# Patient Record
Sex: Female | Born: 1955 | Race: Black or African American | Hispanic: No | Marital: Married | Smoking: Never smoker
Health system: Southern US, Community
[De-identification: ages and names within clinical notes are randomized; demographics above are authoritative.]

## PROBLEM LIST (undated history)

## (undated) DIAGNOSIS — I639 Cerebral infarction, unspecified: Secondary | ICD-10-CM

## (undated) DIAGNOSIS — I10 Essential (primary) hypertension: Secondary | ICD-10-CM

## (undated) DIAGNOSIS — D869 Sarcoidosis, unspecified: Secondary | ICD-10-CM

## (undated) DIAGNOSIS — K219 Gastro-esophageal reflux disease without esophagitis: Secondary | ICD-10-CM

## (undated) HISTORY — DX: Gastro-esophageal reflux disease without esophagitis: K21.9

## (undated) HISTORY — PX: BREAST BIOPSY: SHX20

## (undated) HISTORY — DX: Sarcoidosis, unspecified: D86.9

## (undated) HISTORY — PX: COLONOSCOPY: SHX174

## (undated) HISTORY — DX: Essential (primary) hypertension: I10

## (undated) HISTORY — PX: OTHER SURGICAL HISTORY: SHX169

## (undated) HISTORY — PX: VESICOVAGINAL FISTULA CLOSURE W/ TAH: SUR271

## (undated) HISTORY — DX: Cerebral infarction, unspecified: I63.9

---
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
## 2006-06-28 HISTORY — PX: OTHER SURGICAL HISTORY: SHX169

## 2007-06-14 DIAGNOSIS — N390 Urinary tract infection, site not specified: Secondary | ICD-10-CM

## 2007-06-14 HISTORY — DX: Urinary tract infection, site not specified: N39.0

## 2007-06-29 HISTORY — PX: CHOLECYSTECTOMY: SHX55

## 2008-03-26 ENCOUNTER — Ambulatory Visit: Payer: Self-pay | Admitting: Thoracic Surgery (Cardiothoracic Vascular Surgery)

## 2008-03-28 ENCOUNTER — Ambulatory Visit: Payer: Self-pay | Admitting: Thoracic Surgery (Cardiothoracic Vascular Surgery)

## 2008-04-05 ENCOUNTER — Ambulatory Visit: Payer: Self-pay | Admitting: Thoracic Surgery (Cardiothoracic Vascular Surgery)

## 2008-04-05 ENCOUNTER — Encounter: Payer: Self-pay | Admitting: Thoracic Surgery (Cardiothoracic Vascular Surgery)

## 2008-04-05 ENCOUNTER — Ambulatory Visit (HOSPITAL_COMMUNITY)
Admission: RE | Admit: 2008-04-05 | Discharge: 2008-04-05 | Payer: Self-pay | Admitting: Thoracic Surgery (Cardiothoracic Vascular Surgery)

## 2008-04-11 ENCOUNTER — Ambulatory Visit: Payer: Self-pay | Admitting: Thoracic Surgery (Cardiothoracic Vascular Surgery)

## 2008-06-28 HISTORY — PX: MEDIASTINOSCOPY: SUR861

## 2009-11-15 ENCOUNTER — Encounter: Admission: RE | Admit: 2009-11-15 | Discharge: 2009-11-15 | Payer: Self-pay | Admitting: Orthopedic Surgery

## 2009-12-04 ENCOUNTER — Encounter: Admission: RE | Admit: 2009-12-04 | Discharge: 2009-12-04 | Payer: Self-pay | Admitting: Specialist

## 2010-02-03 LAB — PULMONARY FUNCTION TEST

## 2010-05-18 ENCOUNTER — Encounter: Admission: RE | Admit: 2010-05-18 | Discharge: 2010-05-18 | Payer: Self-pay | Admitting: Vascular Surgery

## 2010-06-19 ENCOUNTER — Encounter
Admission: RE | Admit: 2010-06-19 | Discharge: 2010-06-19 | Payer: Self-pay | Source: Home / Self Care | Attending: Allergy and Immunology | Admitting: Allergy and Immunology

## 2010-07-10 ENCOUNTER — Encounter
Admission: RE | Admit: 2010-07-10 | Discharge: 2010-07-10 | Payer: Self-pay | Source: Home / Self Care | Attending: Unknown Physician Specialty | Admitting: Unknown Physician Specialty

## 2010-07-18 ENCOUNTER — Other Ambulatory Visit: Payer: Self-pay | Admitting: Vascular Surgery

## 2010-07-18 DIAGNOSIS — Z1239 Encounter for other screening for malignant neoplasm of breast: Secondary | ICD-10-CM

## 2010-10-19 ENCOUNTER — Ambulatory Visit
Admission: RE | Admit: 2010-10-19 | Discharge: 2010-10-19 | Disposition: A | Discharge status: Home / Self Care | Payer: PRIVATE HEALTH INSURANCE | Source: Ambulatory Visit | Attending: *Deleted | Admitting: *Deleted

## 2010-10-19 ENCOUNTER — Other Ambulatory Visit: Payer: Self-pay | Admitting: *Deleted

## 2010-10-19 DIAGNOSIS — D869 Sarcoidosis, unspecified: Secondary | ICD-10-CM

## 2010-11-10 NOTE — Op Note (Signed)
NAMERIDHIMA, GOLBERG              ACCOUNT NO.:  [id number]   MEDICAL RECORD NO.:  [id number]          PATIENT TYPE:  AMB   LOCATION:  SDS                          FACILITY:  [facility]   PHYSICIAN:  Salvatore Decent. Dorris Fetch, M.D.DATE OF BIRTH:  09-01-1955   DATE OF PROCEDURE:  04/05/2008  DATE OF DISCHARGE:  04/05/2008                               OPERATIVE REPORT   PREOPERATIVE DIAGNOSES:  Mediastinal and hilar adenopathy.   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   PROCEDURE:  Mediastinoscopy.   SURGEON:  Salvatore Decent. Dorris Fetch, MD   ANESTHESIA:  General.   FINDINGS:  Enlarged nodes in the mediastinum.  Frozen section revealed  noncaseating granulomas consistent with sarcoidosis.   CLINICAL NOTE:  Ms. Flight is a 55 year old woman who presented with  cough and persistent dyspnea.  Workup had revealed mediastinal  adenopathy in June of this year.  She subsequently had a repeat CT scan  in September which showed interval increase in the size of her lymph  nodes.  She was referred for mediastinoscopy.  I discussed in detail  with her the indications, risks, benefits, and alternatives.  She  understood and accepted risks and agreed to proceed.   OPERATIVE NOTE:  Ms. Cranmer was brought to the preop holding area on  April 05, 2008.  There, Anesthesia established intravenous access and  arterial blood pressure monitoring catheter was placed.  Intravenous  antibiotics were administered.  PAS hose were placed for DVT  prophylaxis.  The patient was taken to the operating room, anesthetized,  and intubated.  The neck and chest were prepped and draped in the usual  fashion.  An incision was made 1 fingerbreadth above the sternal notch  in a transverse fashion.  It was carried through the skin and  subcutaneous tissue.  The strap muscles were separated in midline.  The  pretracheal fascia was identified and incised.  The pretracheal plane  was  developed bluntly into the mediastinum.  Mediastinoscope was  inserted and systematic inspection of the mediastinal lymph node  stations was carried out.  There were enlarged gray-to-whitish appearing  nodes.  Biopsies were taken from multiple lymph node stations.  Hemostasis was achieved with minimal use of electrocautery.  Multiple  nodes were sent for both pathology and culture.  Frozen section of one  of the nodes revealed noncaseating granulomas consistent with  sarcoidosis.  The wound was packed for 5 minutes.  The packing was  removed.  Mediastinoscope was inserted for final inspection for  hemostasis which was good.  The  mediastinoscope was withdrawn.  The incision was closed in 2 layers with  an interrupted 3-0 Vicryl subcutaneous suture and a 4-0 Vicryl  subcuticular suture.  All sponge, needle, and instrument counts were  correct at the end of the procedure.  The patient was extubated in the  operating room and taken to the recovery room in good condition.      Salvatore Decent Dorris Fetch, M.D.  Electronically Signed     SCH/MEDQ  D:  04/08/2008  T:  04/09/2008  Job:  [id number]   cc:  Celine Mans  Tanvir A. Chodri, M.D.

## 2010-11-10 NOTE — Consult Note (Signed)
NEW PATIENT CONSULTATION   LAVELLE, BERLAND D  DOB:  12/10/1955                                        March 26, 2008  CHART #:  [id number]   The patient is a 55 year old woman, who presents with a chief complaint  of dyspnea.   The patient was in her usual state of health until June 2009 when she  noted onset of cough and shortness of breath.  She saw her medical  doctor and was treated, but continued to have a persistent cough.  One  day, she stopped at Urgent Care, a chest x-ray was done and she was  noted to have question of enlarged mediastinal lymph nodes.  A CT scan  then was done, this was in June and showed some mediastinal adenopathy.  This was rather mild and she was scheduled for a repeat scan, which was  done on February 27, 2008.  That scan showed interval enlargement of the  mediastinal and bilateral adenopathy.  Of note, she does not have any  significant pulmonary nodules or infiltrates.  She has continued to have  dyspnea, particularly with exertion.  Her cough has improved.  She also  complains of occasional sharp left-sided chest pain.  She denies any  exposure to tuberculosis.  She has not been around any construction  sites, pigeons, and has not traveled out of the country recently.  She  says that she has not had any fevers, although she did feel chilled last  week.  She did not feel ill enough to seek medical attention for it.  She has not had any night sweats.  She does state that sometimes after  she eats, she gets a burning sensation in her lower chest.  She does not  get that with exertion.   PAST MEDICAL HISTORY:  Significant for hypertension, previous breast  biopsies, previous total hysterectomy, and cholecystectomy.   CURRENT MEDICATIONS:  Vivelle patch 0.1 mg per 24 hours twice weekly,  moexipril/hydrochlorothiazide 15/12.5 one tablet daily, Prevacid 30 mg  daily as needed, and aspirin 81 mg daily.   ALLERGIES:  She has  allergies.  She lists IV contrast, which causes GI  upset.  No anaphylactic symptoms.   FAMILY HISTORY:  Significant for heart disease with coronary artery  disease in both mother and father.  No history of sarcoid or lymphoma.   SOCIAL HISTORY:  She is married.  She works full-time as a Public librarian.  She does not drink or smoke.   REVIEW OF SYSTEMS:  The patient's medical history form is reviewed and  is on the chart.  Of note, she complains of this pressure along her  upper abdomen, reflux-type burning sensation, shortness of breath  particularly when walking up an incline.  On careful questioning, this  does not sound consistent with angina.  She also complains of headaches  and also has had recent change in eyesight.  Recently, she got a new  prescription for her glasses, but still is having some difficulty.  She  has not had any recent weight loss.   PHYSICAL EXAMINATION:  GENERAL:  The patient is an overweight, 55-year-  old [place] American female in no acute distress.  VITAL SIGNS:  Her blood pressure is 141/94, pulse 66, respirations are  18, and her oxygen saturation is 95% on  room air.  HEENT:  She is wearing glasses, otherwise unremarkable.  NECK:  Supple without thyromegaly, adenopathy, or bruits.  LUNGS:  Clear with equal breath sounds bilaterally.  CARDIAC:  Regular rate and rhythm.  Normal S1 and S2.  No rubs or  murmurs.  ABDOMEN:  Soft and nontender.  There is no hepatosplenomegaly.  EXTREMITIES:  Without clubbing, cyanosis, or edema.  She has 2+ pulses  throughout.   CT scan is reviewed.  It shows some moderate mediastinal and hilar  adenopathy.   IMPRESSION:  The patient is a 55 year old [place] American female, who  presented with cough and dyspnea.  She has had persistent dyspnea.  She  has been found to have mediastinal adenopathy.  She has not had recent  infection and the nodes have increased in size from June until  September.   Therefore, I do think investigation is warranted.  The  likely differential diagnosis is sarcoidosis versus lymphoma, although  there are other possibilities, those are the two most likely causes with  sarcoid being the more likely of the two.  I reviewed the CT scan with  the patient and her husband and discussed our options.  I recommended to  them that we proceed with mediastinoscopy.  I discussed in detail with  them the indications, risks, benefits, and alternatives.  They  understand the risks include, but not limited to bleeding, possible need  for transfusions or bigger operation, pneumothorax, infection, recurrent  nerve injury with  transient or permanent hoarseness, stroke, or  esophageal injury.  She understands and accepts these risks and agrees  to proceed with biopsy.  They do understand that this is diagnostic and  not therapeutic and they do understand that there is no definite  causative link between the mediastinal nodes and  her symptoms, but do feel it is important to establish a diagnosis given  the interval growth of these lymph nodes.   Salvatore Decent Dorris Fetch, M.D.  Electronically Signed   SCH/MEDQ  D:  03/26/2008  T:  03/26/2008  Job:  [id number]   cc:   Celine Mans  Tanvir A. Chodri, M.D.

## 2010-11-10 NOTE — Assessment & Plan Note (Signed)
OFFICE VISIT   DEEM, MARMOL  DOB:  1955-10-17                                        April 11, 2008  CHART #:  [id number]   The patient is a 55 year old woman, who had presented with a chief  complaint of dyspnea.  She was found to have mediastinal and bilateral  hilar adenopathy.  On April 05, 2008, she underwent mediastinoscopy.  Pathology confirmed noncaseating granulomas consistent with sarcoid.  She returns today.  She said she noticed a lump in her incisions.  She  has been having a frequent cough which is productive and says she has a  feeling of strangling when she is sleeping at night.   PHYSICAL EXAMINATION:  GENERAL:  The patient is a well-appearing 52-year-  old woman in no acute distress.  VITAL SIGNS:  Her blood pressure 141/92, pulse 80, respirations 18, her  oxygen saturation is 99% on room air.  SKIN:  Her incision is clean, dry, and intact.  There is no evidence of  infection.  There is a little induration as well as a little swelling  superiorly.  There is no evidence of infection.  LUNGS:  Mildly congested.   IMPRESSION:  The patient is a 55 year old woman.  She is status post  mediastinoscopy which confirmed diagnosis of sarcoidosis.  I am going to  defer to Dr. Marcellus Scott in [place] regarding treatment of that, so I  have not started her on any medication for that.  Her wound is fine.  I  told her she could put some Neosporin on that if she would like, it may  help that fade a little more quickly.  Regarding her cough, it is most  likely is viral, could be related to sarcoid, I do not think she needs  antibiotics at this point in time.  She has not had any fevers, chills,  or sweats.  I did advise her that she were to have worsening cough or  breathing or develop a high  fever, she is to contact her medical doctor.  She will follow up with  Dr. Royal Hawthorn and Dr. Blenda Nicely.  I would be happy to see her back anytime if  can be of  any further assistance with her care.   Salvatore Decent Dorris Fetch, M.D.  Electronically Signed   SCH/MEDQ  D:  04/11/2008  T:  04/12/2008  Job:  [id number]

## 2010-11-10 NOTE — Letter (Signed)
April 11, 2008   Tanvir A. Chodri, MD  [address], [place] [address]   Re:  Desiree Montgomery, Desiree Montgomery              DOB:  1956-05-31   Dear Eual Fines:   I saw the patient back in the office.  As you know, she is a very nice  55 year old woman who had shortness of breath and a persisting cough.  She was found by chest x-ray and CT that had mediastinal and hilar  adenopathy.  We did a mediastinoscopy on 04/05/2008, which revealed non-  caseating granulomas consistent with sarcoidosis.  I advised the patient  to contact to your office for followup.  I will defer to your expertise  in terms of treatment.  Please do not hesitate to contact me, if I can  be of any further assistance with her care.   Salvatore Decent Dorris Fetch, M.D.  Electronically Signed   SCH/MEDQ  D:  04/11/2008  T:  04/11/2008  Job:  [id number]   cc:   Celine Mans

## 2011-02-26 ENCOUNTER — Other Ambulatory Visit: Payer: Self-pay | Admitting: Unknown Physician Specialty

## 2011-02-26 ENCOUNTER — Ambulatory Visit
Admission: RE | Admit: 2011-02-26 | Discharge: 2011-02-26 | Disposition: A | Discharge status: Home / Self Care | Payer: PRIVATE HEALTH INSURANCE | Source: Ambulatory Visit | Attending: Unknown Physician Specialty | Admitting: Unknown Physician Specialty

## 2011-02-26 DIAGNOSIS — R609 Edema, unspecified: Secondary | ICD-10-CM

## 2011-03-25 ENCOUNTER — Telehealth: Payer: Self-pay | Admitting: Critical Care Medicine

## 2011-03-25 NOTE — Telephone Encounter (Signed)
Forwarded to Dr. Wright for review. °

## 2011-03-30 LAB — AFB CULTURE WITH SMEAR (NOT AT ARMC)

## 2011-03-30 LAB — CBC
Hemoglobin: 14
MCHC: 33.6
RBC: 4.76
WBC: 3.6 — ABNORMAL LOW

## 2011-03-30 LAB — COMPREHENSIVE METABOLIC PANEL
ALT: 25
AST: 28
Alkaline Phosphatase: 81
CO2: 31
Calcium: 9.6
Creatinine, Ser: 0.77
GFR calc Af Amer: >60
Sodium: 139
Total Bilirubin: 0.4

## 2011-03-30 LAB — APTT: aPTT: 28

## 2011-03-30 LAB — TISSUE CULTURE

## 2011-03-30 LAB — TYPE AND SCREEN
ABO/RH(D): O POS
Antibody Screen: NEGATIVE

## 2011-03-30 LAB — PROTIME-INR: INR: 1

## 2011-03-30 LAB — FUNGUS CULTURE W SMEAR

## 2011-03-31 ENCOUNTER — Encounter: Payer: Self-pay | Admitting: Critical Care Medicine

## 2011-04-01 ENCOUNTER — Encounter: Payer: Self-pay | Admitting: Critical Care Medicine

## 2011-04-01 ENCOUNTER — Ambulatory Visit (INDEPENDENT_AMBULATORY_CARE_PROVIDER_SITE_OTHER): Payer: PRIVATE HEALTH INSURANCE | Admitting: Critical Care Medicine

## 2011-04-01 VITALS — BP 120/70 | HR 84 | Temp 98.4°F | Ht 65.0 in | Wt 208.0 lb

## 2011-04-01 DIAGNOSIS — D869 Sarcoidosis, unspecified: Secondary | ICD-10-CM

## 2011-04-01 DIAGNOSIS — K219 Gastro-esophageal reflux disease without esophagitis: Secondary | ICD-10-CM

## 2011-04-01 DIAGNOSIS — I10 Essential (primary) hypertension: Secondary | ICD-10-CM

## 2011-04-01 MED ORDER — MOMETASONE FUROATE 220 MCG/INH IN AEPB: 2.0000 | INHALATION_SPRAY | Freq: Every day | RESPIRATORY_TRACT | Status: DC

## 2011-04-01 NOTE — Patient Instructions (Signed)
Stop symbicort Start Asmanex two puff daily Use albuterol as needed No prednisone needed Your breathing test was normal Return 4 months

## 2011-04-01 NOTE — Progress Notes (Signed)
Subjective:    Patient ID: Desiree Montgomery, female    DOB: 1955/12/14, 55 y.o.   MRN: [id number]  HPI Comments: Saw Chodri one year ago for chronic cough.    Cough now has resolved with Rx of GERD, had scratchy feel to throat, neg endoscopy exam.  10/2008- 06/2009 then it gradually is better.  Still does do throat clearing. Also notes dyspnea if weather is humid.  Had to use SABA with exertion. Dx Sarcoidosis, mediastinoscopy at [facility]  LN pos for sarcoid 2010.  Rx inhaler, PFTs ? Results.  Rx pred for 12months. Then had side effects on pred slow taper to off 1.5 yrs ago per Chodri.   55 y.o. WF Hx of GERD and sarcoidosis.  No prior Dx of asthma   Past Medical History  Diagnosis Date  . Sarcoidosis   . Hypertension   . GERD (gastroesophageal reflux disease)      Family History  Problem Relation Age of Onset  . Diabetes Son   . Ovarian cancer Mother   . Colon cancer Mother   . Heart disease Mother   . Heart disease Father   . Heart disease Maternal Grandfather      History   Social History  . Marital Status: Married    Spouse Name: N/A    Number of Children: 2  . Years of Education: N/A   Occupational History  . Customer Service Rep    Social History Main Topics  . Smoking status: Never Smoker   . Smokeless tobacco: Never Used  . Alcohol Use: Yes     socially  . Drug Use: No  . Sexually Active: Not on file   Other Topics Concern  . Not on file   Social History Narrative  . No narrative on file     Allergies  Allergen Reactions  . Ivp Dye (Iodinated Diagnostic Agents)     Dry "heaves"     Outpatient Prescriptions Prior to Visit  Medication Sig Dispense Refill  . albuterol (PROVENTIL HFA;VENTOLIN HFA) 108 (90 BASE) MCG/ACT inhaler Inhale 2 puffs into the lungs every 4 (four) hours as needed.       Marland Kitchen aspirin 81 MG tablet Take 81 mg by mouth daily.        Marland Kitchen CRANBERRY PO 850 mg once weekly      . diltiazem (CARDIZEM) 120 MG tablet Take 120 mg by mouth daily.         . lansoprazole (PREVACID) 30 MG capsule Take 30 mg by mouth 2 (two) times daily.        . mometasone (NASONEX) 50 MCG/ACT nasal spray Place 2 sprays into the nose daily as needed.       . Multiple Vitamin (MULTIVITAMIN) capsule Take 1 capsule by mouth daily.        . ranitidine (ZANTAC) 300 MG tablet Take 300 mg by mouth at bedtime.        . budesonide-formoterol (SYMBICORT) 160-4.5 MCG/ACT inhaler Inhale 2 puffs into the lungs 2 (two) times daily as needed. Unsure of strength      . Omega-3 Fatty Acids (FISH OIL) 1200 MG CAPS Take 2 capsules by mouth 2 (two) times daily.        . sertraline (ZOLOFT) 100 MG tablet Take 100 mg by mouth daily.            Review of Systems  Constitutional: Positive for diaphoresis, appetite change and fatigue. Negative for fever, chills, activity change and unexpected weight change.  HENT: Positive for sneezing. Negative for hearing loss, ear pain, nosebleeds, congestion, sore throat, facial swelling, rhinorrhea, mouth sores, trouble swallowing, neck pain, neck stiffness, dental problem, voice change, postnasal drip, sinus pressure, tinnitus and ear discharge.   Eyes: Negative for photophobia, discharge, itching and visual disturbance.  Respiratory: Positive for chest tightness, shortness of breath and wheezing. Negative for apnea, cough, choking and stridor.   Cardiovascular: Positive for chest pain, palpitations and leg swelling.  Gastrointestinal: Negative for nausea, vomiting, abdominal pain, constipation, blood in stool and abdominal distention.  Genitourinary: Negative for dysuria, urgency, frequency, hematuria, flank pain, decreased urine volume and difficulty urinating.  Musculoskeletal: Positive for arthralgias and gait problem. Negative for myalgias, back pain and joint swelling.  Skin: Negative for color change, pallor and rash.  Neurological: Positive for numbness and headaches. Negative for dizziness, tremors, seizures, syncope, speech difficulty,  weakness and light-headedness.  Hematological: Negative for adenopathy. Bruises/bleeds easily.  Psychiatric/Behavioral: Positive for agitation. Negative for confusion and sleep disturbance. The patient is nervous/anxious.        Objective:   Physical Exam Filed Vitals:   04/01/11 1419  BP: 120/70  Pulse: 84  Temp: 98.4 F (36.9 C)  Height: 5\' 5"  (1.651 m)  Weight: 208 lb (94.348 kg)  SpO2: 99%    Gen: Pleasant, well-nourished, in no distress,  normal affect  ENT: No lesions,  mouth clear,  oropharynx clear, no postnasal drip  Neck: No JVD, no TMG, no carotid bruits  Lungs: No use of accessory muscles, no dullness to percussion, clear without rales or rhonchi  Cardiovascular: RRR, heart sounds normal, no murmur or gallops, no peripheral edema  Abdomen: soft and NT, no HSM,  BS normal  Musculoskeletal: No deformities, no cyanosis or clubbing  Neuro: alert, non focal  Skin: Warm, no lesions or rashes  10/19/10 CXR IMPRESSION:  Similar borderline/mild hilar adenopathy as detailed above. This  likely corresponds to the history of sarcoidosis. No acute  superimposed process.        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 albuterol (PROVENTIL HFA;VENTOLIN HFA) 108 (90 BASE) MCG/ACT inhaler Inhale 2 puffs into the lungs every 4 (four) hours as needed.       . ALPRAZolam (XANAX) 0.25 MG tablet Take 0.25 mg by mouth every 12 (twelve) hours as needed.        Marland Kitchen aspirin 81 MG tablet Take 81 mg by mouth daily.        . chlorthalidone (HYGROTON) 25 MG tablet Take 12.5 mg  by mouth daily.        Marland Kitchen CRANBERRY PO 850 mg once weekly      . diltiazem (CARDIZEM) 120 MG tablet Take 120 mg by mouth daily.        . lansoprazole (PREVACID) 30 MG capsule Take 30 mg by mouth 2 (two) times daily.        . mometasone (NASONEX) 50 MCG/ACT nasal spray Place 2 sprays into the nose daily as needed.       . Multiple Vitamin (MULTIVITAMIN) capsule Take 1 capsule by mouth daily.        . ranitidine (ZANTAC) 300 MG  tablet Take 300 mg by mouth at bedtime.        Marland Kitchen DISCONTD: budesonide-formoterol (SYMBICORT) 160-4.5 MCG/ACT inhaler Inhale 2 puffs into the lungs 2 (two) times daily as needed. Unsure of strength      . mometasone (ASMANEX 120 METERED DOSES) 220 MCG/INH inhaler Inhale 2 puffs into the lungs daily.  1 Inhaler  6  . DISCONTD: Omega-3 Fatty Acids (FISH OIL) 1200 MG CAPS Take 2 capsules by mouth 2 (two) times daily.        Marland Kitchen DISCONTD: sertraline (ZOLOFT) 100 MG tablet Take 100 mg by mouth daily.

## 2011-04-02 NOTE — Assessment & Plan Note (Signed)
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

## 2011-05-03 ENCOUNTER — Telehealth: Payer: Self-pay | Admitting: Critical Care Medicine

## 2011-05-03 NOTE — Telephone Encounter (Signed)
Received 33 pages from [facility]; forwarded to Dr. Delford Field for review. 05/03/11-ar

## 2011-05-13 ENCOUNTER — Encounter: Payer: Self-pay | Admitting: Critical Care Medicine

## 2011-05-24 ENCOUNTER — Other Ambulatory Visit: Payer: Self-pay | Admitting: Vascular Surgery

## 2011-05-24 ENCOUNTER — Ambulatory Visit
Admission: RE | Admit: 2011-05-24 | Discharge: 2011-05-24 | Disposition: A | Discharge status: Home / Self Care | Payer: PRIVATE HEALTH INSURANCE | Source: Ambulatory Visit | Attending: Vascular Surgery | Admitting: Vascular Surgery

## 2011-05-24 DIAGNOSIS — Z1239 Encounter for other screening for malignant neoplasm of breast: Secondary | ICD-10-CM

## 2011-08-05 ENCOUNTER — Encounter: Payer: Self-pay | Admitting: Critical Care Medicine

## 2011-08-05 ENCOUNTER — Ambulatory Visit (INDEPENDENT_AMBULATORY_CARE_PROVIDER_SITE_OTHER): Payer: PRIVATE HEALTH INSURANCE | Admitting: Critical Care Medicine

## 2011-08-05 VITALS — BP 130/78 | HR 73 | Temp 98.0°F | Ht 66.0 in | Wt 203.0 lb

## 2011-08-05 DIAGNOSIS — K219 Gastro-esophageal reflux disease without esophagitis: Secondary | ICD-10-CM

## 2011-08-05 DIAGNOSIS — D869 Sarcoidosis, unspecified: Secondary | ICD-10-CM

## 2011-08-05 MED ORDER — ALBUTEROL SULFATE HFA 108 (90 BASE) MCG/ACT IN AERS: 2.0000 | INHALATION_SPRAY | RESPIRATORY_TRACT | Status: DC | PRN

## 2011-08-05 MED ORDER — RANITIDINE HCL 300 MG PO TABS: 300.0000 mg | ORAL_TABLET | Freq: Every day | ORAL | Status: DC

## 2011-08-05 NOTE — Assessment & Plan Note (Addendum)
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

## 2011-08-05 NOTE — Patient Instructions (Signed)
No change in inhalers Resume ranitidine 300mg  daily Stay on Dexilant Follow Strict reflux diet Return 4 months

## 2011-08-05 NOTE — Assessment & Plan Note (Signed)
Severe GERD which exacerbates lung disease Plan See sarcoid assessment

## 2011-08-05 NOTE — Progress Notes (Signed)
Subjective:    Patient ID: Desiree Montgomery, female    DOB: [birth year], 56 y.o.   MRN: [id number]  HPI Comments: Saw Chodri one year ago for chronic cough.    Cough now has resolved with Rx of GERD, had scratchy feel to throat, neg endoscopy exam.  10/2008- 06/2009 then it gradually is better.  Still does do throat clearing. Also notes dyspnea if weather is humid.  Had to use SABA with exertion. Dx Sarcoidosis, mediastinoscopy at [facility]  LN pos for sarcoid 2010.  Rx inhaler, PFTs ? Results.  Rx pred for 12months. Then had side effects on pred slow taper to off 1.5 yrs ago per Chodri.   56 y.o. WF Hx of GERD and sarcoidosis.  No prior Dx of asthma  08/05/2011 LAst seen 10/12, Rec asmanex and stopped symbicort. Was ok until cold air hit and now has heaviness in chest area.  Uses antacid and helps. Notes some heartburn.  Already on dexilant daily and will get breakthrough.  No qhs symptoms, worse in evening.  Feels tight in chest area. No cough.  Not that dyspneic, worse in cold with some dyspnea.  No real mucus.  No sore throat , no dysphagia.    Pt denies any significant sore throat, nasal congestion or excess secretions, fever, chills, sweats, unintended weight loss, pleurtic or exertional chest pain, orthopnea PND, or leg swelling Pt denies any increase in rescue therapy over baseline, denies waking up needing it or having any early am or nocturnal exacerbations of coughing/wheezing/or dyspnea. Pt also denies any obvious fluctuation in symptoms with  weather or environmental change or other alleviating or aggravating factors    Past Medical History  Diagnosis Date  . Sarcoidosis   . Hypertension   . GERD (gastroesophageal reflux disease)      Family History  Problem Relation Age of Onset  . Diabetes Son   . Ovarian cancer Mother   . Colon cancer Mother   . Heart disease Mother   . Heart disease Father   . Heart disease Maternal Grandfather      History   Social History  . Marital  Status: Married    Spouse Name: N/A    Number of Children: 2  . Years of Education: N/A   Occupational History  . Customer Service Rep    Social History Main Topics  . Smoking status: Never Smoker   . Smokeless tobacco: Never Used  . Alcohol Use: Yes     socially  . Drug Use: No  . Sexually Active: Not on file   Other Topics Concern  . Not on file   Social History Narrative  . No narrative on file     Allergies  Allergen Reactions  . Ivp Dye (Iodinated Diagnostic Agents)     Dry "heaves"     Outpatient Prescriptions Prior to Visit  Medication Sig Dispense Refill  . ALPRAZolam (XANAX) 0.25 MG tablet Take 0.25 mg by mouth every 12 (twelve) hours as needed.        Marland Kitchen aspirin 81 MG tablet Take 81 mg by mouth daily.        . chlorthalidone (HYGROTON) 25 MG tablet Take 12.5 mg by mouth daily.        Marland Kitchen CRANBERRY PO 850 mg as needed      . diltiazem (CARDIZEM) 120 MG tablet Take 120 mg by mouth daily.        . mometasone (ASMANEX 120 METERED DOSES) 220 MCG/INH inhaler Inhale 2  puffs into the lungs daily.  1 Inhaler  6  . mometasone (NASONEX) 50 MCG/ACT nasal spray Place 2 sprays into the nose daily as needed.       . Multiple Vitamin (MULTIVITAMIN) capsule Take 1 capsule by mouth daily.        Marland Kitchen albuterol (PROVENTIL HFA;VENTOLIN HFA) 108 (90 BASE) MCG/ACT inhaler Inhale 2 puffs into the lungs every 4 (four) hours as needed.       . ranitidine (ZANTAC) 300 MG tablet Take 300 mg by mouth at bedtime.        . lansoprazole (PREVACID) 30 MG capsule Take 30 mg by mouth 2 (two) times daily.            Review of Systems  Constitutional: Positive for diaphoresis, appetite change and fatigue. Negative for fever, chills, activity change and unexpected weight change.  HENT: Negative for hearing loss, ear pain, nosebleeds, congestion, sore throat, facial swelling, rhinorrhea, sneezing, mouth sores, trouble swallowing, neck pain, neck stiffness, dental problem, voice change, postnasal drip,  sinus pressure, tinnitus and ear discharge.   Eyes: Negative for photophobia, discharge, itching and visual disturbance.  Respiratory: Positive for wheezing. Negative for apnea, cough, choking, chest tightness, shortness of breath and stridor.   Cardiovascular: Positive for chest pain. Negative for palpitations and leg swelling.  Gastrointestinal: Negative for nausea, vomiting, abdominal pain, constipation, blood in stool and abdominal distention.  Genitourinary: Negative for dysuria, urgency, frequency, hematuria, flank pain, decreased urine volume and difficulty urinating.  Musculoskeletal: Positive for arthralgias and gait problem. Negative for myalgias, back pain and joint swelling.  Skin: Negative for color change, pallor and rash.  Neurological: Positive for numbness and headaches. Negative for dizziness, tremors, seizures, syncope, speech difficulty, weakness and light-headedness.  Hematological: Negative for adenopathy. Bruises/bleeds easily.  Psychiatric/Behavioral: Positive for agitation. Negative for confusion and sleep disturbance. The patient is nervous/anxious.        Objective:   Physical Exam  Filed Vitals:   08/05/11 0929  BP: 130/78  Pulse: 73  Temp: 98 F (36.7 C)  TempSrc: Oral  Height: 5\' 6"  (1.676 m)  Weight: 203 lb (92.08 kg)  SpO2: 100%    Gen: Pleasant, well-nourished, in no distress,  normal affect  ENT: No lesions,  mouth clear,  oropharynx clear, no postnasal drip  Neck: No JVD, no TMG, no carotid bruits  Lungs: No use of accessory muscles, no dullness to percussion, clear without rales or rhonchi  Cardiovascular: RRR, heart sounds normal, no murmur or gallops, no peripheral edema  Abdomen: soft and NT, no HSM,  BS normal  Musculoskeletal: No deformities, no cyanosis or clubbing  Neuro: alert, non focal  Skin: Warm, no lesions or rashes  10/19/10 CXR IMPRESSION:  Similar borderline/mild hilar adenopathy as detailed above. This  likely  corresponds to the history of sarcoidosis. No acute  superimposed process.        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 albuterol (PROVENTIL HFA;VENTOLIN HFA) 108 (90 BASE) MCG/ACT  inhaler Inhale 2 puffs into the lungs every 4 (four) hours as needed.  1 Inhaler  4  . ALPRAZolam (XANAX) 0.25 MG tablet Take 0.25 mg by mouth every 12 (twelve) hours as needed.        Marland Kitchen aspirin 81 MG tablet Take 81 mg by mouth daily.        Marland Kitchen Bioflavonoid Products (BIOFLEX PO) Take 1 tablet by mouth daily.      Marland Kitchen BIOTIN PO Take 1 tablet by mouth daily.      . chlorthalidone (HYGROTON) 25 MG tablet Take 12.5 mg by mouth daily.        Marland Kitchen CRANBERRY PO 850 mg as needed      . dexlansoprazole (DEXILANT) 60 MG capsule Take 60 mg by mouth daily.      Marland Kitchen diltiazem (CARDIZEM) 120 MG tablet Take 120 mg by mouth daily.        . mometasone (ASMANEX 120 METERED DOSES) 220 MCG/INH inhaler Inhale 2 puffs into the lungs daily.  1 Inhaler  6  . mometasone (NASONEX) 50 MCG/ACT nasal spray Place 2 sprays into the nose daily as needed.       . Multiple Vitamin (MULTIVITAMIN) capsule Take 1 capsule by mouth daily.        . ranitidine (ZANTAC) 300 MG tablet Take 1 tablet (300 mg total) by mouth at bedtime.  30 tablet  6  . DISCONTD: albuterol (PROVENTIL HFA;VENTOLIN HFA) 108 (90 BASE) MCG/ACT inhaler Inhale 2 puffs into the lungs every 4 (four)  hours as needed.       Marland Kitchen DISCONTD: ranitidine (ZANTAC) 300 MG tablet Take 300 mg by mouth at bedtime.        Marland Kitchen DISCONTD: lansoprazole (PREVACID) 30 MG capsule Take 30 mg by mouth 2 (two) times daily.

## 2012-02-01 ENCOUNTER — Telehealth: Payer: Self-pay | Admitting: Critical Care Medicine

## 2012-02-01 NOTE — Telephone Encounter (Signed)
Called pt to schedule follow up apt x3.Sent letter 01/24/12. ° °

## 2012-03-27 ENCOUNTER — Other Ambulatory Visit: Payer: Self-pay | Admitting: Vascular Surgery

## 2012-03-27 DIAGNOSIS — N6320 Unspecified lump in the left breast, unspecified quadrant: Secondary | ICD-10-CM

## 2012-03-28 ENCOUNTER — Ambulatory Visit
Admission: RE | Admit: 2012-03-28 | Discharge: 2012-03-28 | Disposition: A | Discharge status: Home / Self Care | Payer: PRIVATE HEALTH INSURANCE | Source: Ambulatory Visit | Attending: Vascular Surgery | Admitting: Vascular Surgery

## 2012-03-28 DIAGNOSIS — N6320 Unspecified lump in the left breast, unspecified quadrant: Secondary | ICD-10-CM

## 2012-04-07 ENCOUNTER — Other Ambulatory Visit: Payer: Self-pay | Admitting: General Practice

## 2012-04-07 ENCOUNTER — Ambulatory Visit
Admission: RE | Admit: 2012-04-07 | Discharge: 2012-04-07 | Disposition: A | Discharge status: Home / Self Care | Payer: PRIVATE HEALTH INSURANCE | Source: Ambulatory Visit | Attending: General Practice | Admitting: General Practice

## 2012-04-07 DIAGNOSIS — M79609 Pain in unspecified limb: Secondary | ICD-10-CM

## 2012-04-13 ENCOUNTER — Ambulatory Visit: Payer: PRIVATE HEALTH INSURANCE | Admitting: Critical Care Medicine

## 2012-04-13 ENCOUNTER — Encounter: Payer: Self-pay | Admitting: Critical Care Medicine

## 2012-04-13 ENCOUNTER — Ambulatory Visit (INDEPENDENT_AMBULATORY_CARE_PROVIDER_SITE_OTHER): Payer: PRIVATE HEALTH INSURANCE | Admitting: Critical Care Medicine

## 2012-04-13 VITALS — BP 140/82 | HR 72 | Temp 98.2°F | Ht 66.0 in | Wt 209.0 lb

## 2012-04-13 DIAGNOSIS — Z23 Encounter for immunization: Secondary | ICD-10-CM

## 2012-04-13 DIAGNOSIS — D869 Sarcoidosis, unspecified: Secondary | ICD-10-CM

## 2012-04-13 MED ORDER — PNEUMOCOCCAL VAC POLYVALENT 25 MCG/0.5ML IJ INJ: 0.5000 mL | INJECTION | Freq: Once | INTRAMUSCULAR | Status: DC

## 2012-04-13 NOTE — Progress Notes (Signed)
Subjective:    Patient ID: Desiree Montgomery, female    DOB: 09-05-1955, 56 y.o.   MRN: [id number]  HPI Comments: Saw Chodri 2011  for chronic cough.    Cough now has resolved with Rx of GERD, had scratchy feel to throat, neg endoscopy exam.  10/2008- 06/2009 then it gradually is better.  Still does do throat clearing. Also notes dyspnea if weather is humid.  Had to use SABA with exertion. Dx Sarcoidosis, mediastinoscopy at [facility]  LN pos for sarcoid 2010.  Rx inhaler, PFTs ? Results.  Rx pred for 12months. Then had side effects on pred slow taper to off 1.5 yrs ago per Chodri.   56 y.o. WF Hx of GERD and sarcoidosis.  No prior Dx of asthma  04/13/2012 f/u sarcoid/GERD.   Now not much cough,  Pt notes if goes to post office and bring back mail [place] will wheeze.  Does ok up hill and carrying the mail.  No real chest pain.  No wheeze.  Dx of RA.  Pt noted soreness in ankle and shoulder.  ?tendinitis.  Rheumatoid factor pos.  Placed steroids.  Urgent Care made the dx.   Past Medical History  Diagnosis Date  . Sarcoidosis   . Hypertension   . GERD (gastroesophageal reflux disease)   . Rheumatoid arthritis      Family History  Problem Relation Age of Onset  . Diabetes Son   . Ovarian cancer Mother   . Colon cancer Mother   . Heart disease Mother   . Heart disease Father   . Heart disease Maternal Grandfather      History   Social History  . Marital Status: Married    Spouse Name: N/A    Number of Children: 2  . Years of Education: N/A   Occupational History  . Customer Service Rep    Social History Main Topics  . Smoking status: Never Smoker   . Smokeless tobacco: Never Used  . Alcohol Use: Yes     socially  . Drug Use: No  . Sexually Active: Not on file   Other Topics Concern  . Not on file   Social History Narrative  . No narrative on file     Allergies  Allergen Reactions  . Ivp Dye (Iodinated Diagnostic Agents)     Dry "heaves"     Outpatient Prescriptions  Prior to Visit  Medication Sig Dispense Refill  . albuterol (PROVENTIL HFA;VENTOLIN HFA) 108 (90 BASE) MCG/ACT inhaler Inhale 2 puffs into the lungs every 4 (four) hours as needed.  1 Inhaler  4  . ALPRAZolam (XANAX) 0.25 MG tablet Take 0.25 mg by mouth every 12 (twelve) hours as needed.        Marland Kitchen aspirin 81 MG tablet Take 81 mg by mouth daily.        Marland Kitchen Bioflavonoid Products (BIOFLEX PO) Take 1 tablet by mouth daily.      Marland Kitchen BIOTIN PO Take 1 tablet by mouth daily.      . chlorthalidone (HYGROTON) 25 MG tablet Take 12.5 mg by mouth daily.        Marland Kitchen CRANBERRY PO 850 mg as needed      . dexlansoprazole (DEXILANT) 60 MG capsule Take 60 mg by mouth daily.      Marland Kitchen diltiazem (CARDIZEM) 120 MG tablet Take 120 mg by mouth daily.        . mometasone (ASMANEX 120 METERED DOSES) 220 MCG/INH inhaler Inhale 2 puffs into the  lungs daily.  1 Inhaler  6  . Multiple Vitamin (MULTIVITAMIN) capsule Take 1 capsule by mouth daily.        . ranitidine (ZANTAC) 300 MG tablet Take 1 tablet (300 mg total) by mouth at bedtime.  30 tablet  6  . mometasone (NASONEX) 50 MCG/ACT nasal spray Place 2 sprays into the nose daily as needed.           Review of Systems  Constitutional: Positive for diaphoresis, appetite change and fatigue. Negative for fever, chills, activity change and unexpected weight change.  HENT: Negative for hearing loss, ear pain, nosebleeds, congestion, sore throat, facial swelling, rhinorrhea, sneezing, mouth sores, trouble swallowing, neck pain, neck stiffness, dental problem, voice change, postnasal drip, sinus pressure, tinnitus and ear discharge.   Eyes: Negative for photophobia, discharge, itching and visual disturbance.  Respiratory: Positive for wheezing. Negative for apnea, cough, choking, chest tightness, shortness of breath and stridor.   Cardiovascular: Positive for chest pain. Negative for palpitations and leg swelling.  Gastrointestinal: Negative for nausea, vomiting, abdominal pain,  constipation, blood in stool and abdominal distention.  Genitourinary: Negative for dysuria, urgency, frequency, hematuria, flank pain, decreased urine volume and difficulty urinating.  Musculoskeletal: Positive for arthralgias and gait problem. Negative for myalgias, back pain and joint swelling.  Skin: Negative for color change, pallor and rash.  Neurological: Positive for numbness and headaches. Negative for dizziness, tremors, seizures, syncope, speech difficulty, weakness and light-headedness.  Hematological: Negative for adenopathy. Bruises/bleeds easily.  Psychiatric/Behavioral: Positive for agitation. Negative for confusion and disturbed wake/sleep cycle. The patient is nervous/anxious.        Objective:   Physical Exam  Filed Vitals:   04/13/12 1413  BP: 140/82  Pulse: 72  Temp: 98.2 F (36.8 C)  TempSrc: Oral  Height: 5\' 6"  (1.676 m)  Weight: 209 lb (94.802 kg)  SpO2: 100%    Gen: Pleasant, well-nourished, in no distress,  normal affect  ENT: No lesions,  mouth clear,  oropharynx clear, no postnasal drip  Neck: No JVD, no TMG, no carotid bruits  Lungs: No use of accessory muscles, no dullness to percussion, clear without rales or rhonchi  Cardiovascular: RRR, heart sounds normal, no murmur or gallops, no peripheral edema  Abdomen: soft and NT, no HSM,  BS normal  Musculoskeletal: No deformities, no cyanosis or clubbing  Neuro: alert, non focal  Skin: Warm, no lesions or rashes     Assessment & Plan:   Sarcoidosis Pulmonary sarcoidosis stage II with normal spirometry and no evidence of extrapulmonary involvement Lower airway inflammation present now improved with inhaled steroid Reflux disease also precipitating component Improvement in cough and mucus production with inhaled steroid Plan Flu vaccine and pneumovax was given Stay on asmanex and dexilant Stop zantac Return 6 months     Updated Medication List Outpatient Encounter Prescriptions as of  04/13/2012  Medication Sig Dispense Refill  . albuterol (PROVENTIL HFA;VENTOLIN HFA) 108 (90 BASE) MCG/ACT inhaler Inhale 2 puffs into the lungs every 4 (four) hours as needed.  1 Inhaler  4  . ALPRAZolam (XANAX) 0.25 MG tablet Take 0.25 mg by mouth every 12 (twelve) hours as needed.        Marland Kitchen aspirin 81 MG tablet Take 81 mg by mouth daily.        Marland Kitchen Bioflavonoid Products (BIOFLEX PO) Take 1 tablet by mouth daily.      Marland Kitchen BIOTIN PO Take 1 tablet by mouth daily.      . chlorthalidone (HYGROTON) 25  MG tablet Take 12.5 mg by mouth daily.        Marland Kitchen CRANBERRY PO 850 mg as needed      . dexlansoprazole (DEXILANT) 60 MG capsule Take 60 mg by mouth daily.      Marland Kitchen diltiazem (CARDIZEM) 120 MG tablet Take 120 mg by mouth daily.        . mometasone (ASMANEX 120 METERED DOSES) 220 MCG/INH inhaler Inhale 2 puffs into the lungs daily.  1 Inhaler  6  . Multiple Vitamin (MULTIVITAMIN) capsule Take 1 capsule by mouth daily.        . predniSONE (DELTASONE) 10 MG tablet Take 10 mg by mouth daily. Taper as directed      . DISCONTD: ranitidine (ZANTAC) 300 MG tablet Take 1 tablet (300 mg total) by mouth at bedtime.  30 tablet  6  . DISCONTD: mometasone (NASONEX) 50 MCG/ACT nasal spray Place 2 sprays into the nose daily as needed.

## 2012-04-13 NOTE — Addendum Note (Signed)
Addended by: Orma Flaming D on: 04/13/2012 03:09 PM   Modules accepted: Orders

## 2012-04-13 NOTE — Assessment & Plan Note (Signed)
Pulmonary sarcoidosis stage II with normal spirometry and no evidence of extrapulmonary involvement Lower airway inflammation present now improved with inhaled steroid Reflux disease also precipitating component Improvement in cough and mucus production with inhaled steroid Plan Flu vaccine and pneumovax was given Stay on asmanex and dexilant Stop zantac Return 6 months

## 2012-04-13 NOTE — Patient Instructions (Addendum)
Flu vaccine and pneumovax was given Stay on asmanex and dexilant Stop zantac Return 6 months

## 2012-04-17 ENCOUNTER — Telehealth: Payer: Self-pay | Admitting: Critical Care Medicine

## 2012-04-17 NOTE — Telephone Encounter (Signed)
Spoke with pt. She states just needs documentation that she had flu and pneumovax at last ov faxed to her PCP. I faxed last ov note to the [facility] that she provided. Nothing further needed.

## 2012-05-15 ENCOUNTER — Other Ambulatory Visit: Payer: Self-pay | Admitting: Critical Care Medicine

## 2012-05-29 ENCOUNTER — Ambulatory Visit
Admission: RE | Admit: 2012-05-29 | Discharge: 2012-05-29 | Disposition: A | Discharge status: Home / Self Care | Payer: PRIVATE HEALTH INSURANCE | Source: Ambulatory Visit | Attending: Vascular Surgery | Admitting: Vascular Surgery

## 2012-05-29 DIAGNOSIS — Z1239 Encounter for other screening for malignant neoplasm of breast: Secondary | ICD-10-CM

## 2012-07-04 ENCOUNTER — Other Ambulatory Visit: Payer: Self-pay | Admitting: Vascular Surgery

## 2012-07-04 DIAGNOSIS — Z1231 Encounter for screening mammogram for malignant neoplasm of breast: Secondary | ICD-10-CM

## 2012-10-11 ENCOUNTER — Telehealth: Payer: Self-pay | Admitting: Critical Care Medicine

## 2012-10-11 NOTE — Telephone Encounter (Signed)
Called patient 3x to schd follow up apt. No return call back. Sent letter 10/11/12.

## 2012-11-02 ENCOUNTER — Ambulatory Visit (INDEPENDENT_AMBULATORY_CARE_PROVIDER_SITE_OTHER): Payer: PRIVATE HEALTH INSURANCE | Admitting: Critical Care Medicine

## 2012-11-02 ENCOUNTER — Encounter: Payer: Self-pay | Admitting: Critical Care Medicine

## 2012-11-02 VITALS — BP 112/64 | HR 70 | Temp 98.1°F | Ht 65.5 in | Wt 211.0 lb

## 2012-11-02 DIAGNOSIS — D869 Sarcoidosis, unspecified: Secondary | ICD-10-CM

## 2012-11-02 MED ORDER — MOMETASONE FUROATE 220 MCG/INH IN AEPB: 2.0000 | INHALATION_SPRAY | Freq: Every day | RESPIRATORY_TRACT | Status: DC

## 2012-11-02 MED ORDER — DEXLANSOPRAZOLE 60 MG PO CPDR: 60.0000 mg | DELAYED_RELEASE_CAPSULE | Freq: Every day | ORAL | Status: AC

## 2012-11-02 NOTE — Assessment & Plan Note (Addendum)
Pulmonary sarcoidosis stage II Improved Plan Cont dexilant/asmanex

## 2012-11-02 NOTE — Patient Instructions (Addendum)
No change in dexilant/ asmanex Return 6 months Good job on your diet and exercise program

## 2012-11-02 NOTE — Progress Notes (Signed)
Subjective:    Patient ID: Desiree Montgomery, female    DOB: 1956-04-03, 57 y.o.   MRN: [id number]  HPI Comments: Saw Chodri 2011  for chronic cough.    Cough now has resolved with Rx of GERD, had scratchy feel to throat, neg endoscopy exam.  10/2008- 06/2009 then it gradually is better.  Still does do throat clearing. Also notes dyspnea if weather is humid.  Had to use SABA with exertion. Dx Sarcoidosis, mediastinoscopy at [facility]  LN pos for sarcoid 2010.  Rx inhaler, PFTs ? Results.  Rx pred for 12months. Then had side effects on pred slow taper to off 1.5 yrs ago per Chodri.   57 y.o. WF Hx of GERD and sarcoidosis.  No prior Dx of asthma    11/02/2012 Overall is better. No cough or wheeze.  No dyspnea. Pt denies any significant sore throat, nasal congestion or excess secretions, fever, chills, sweats, unintended weight loss, pleurtic or exertional chest pain, orthopnea PND, or leg swelling Pt denies any increase in rescue therapy over baseline, denies waking up needing it or having any early am or nocturnal exacerbations of coughing/wheezing/or dyspnea. Pt also denies any obvious fluctuation in symptoms with  weather or environmental change or other alleviating or aggravating factors    Past Medical History  Diagnosis Date  . Sarcoidosis   . Hypertension   . GERD (gastroesophageal reflux disease)   . Rheumatoid arthritis      Family History  Problem Relation Age of Onset  . Diabetes Son   . Ovarian cancer Mother   . Colon cancer Mother   . Heart disease Mother   . Heart disease Father   . Heart disease Maternal Grandfather      History   Social History  . Marital Status: Married    Spouse Name: N/A    Number of Children: 2  . Years of Education: N/A   Occupational History  . Customer Service Rep    Social History Main Topics  . Smoking status: Never Smoker   . Smokeless tobacco: Never Used  . Alcohol Use: Yes     Comment: socially  . Drug Use: No  . Sexually Active:  Not on file   Other Topics Concern  . Not on file   Social History Narrative  . No narrative on file     Allergies  Allergen Reactions  . Ivp Dye (Iodinated Diagnostic Agents)     Dry "heaves"     Outpatient Prescriptions Prior to Visit  Medication Sig Dispense Refill  . albuterol (PROVENTIL HFA;VENTOLIN HFA) 108 (90 BASE) MCG/ACT inhaler Inhale 2 puffs into the lungs every 4 (four) hours as needed.  1 Inhaler  4  . ALPRAZolam (XANAX) 0.25 MG tablet Take 0.25 mg by mouth every 12 (twelve) hours as needed.        Marland Kitchen aspirin 81 MG tablet Take 81 mg by mouth daily.        Marland Kitchen Bioflavonoid Products (BIOFLEX PO) Take 1 tablet by mouth daily.      . chlorthalidone (HYGROTON) 25 MG tablet Take 12.5 mg by mouth daily.        Marland Kitchen CRANBERRY PO 850 mg as needed      . diltiazem (CARDIZEM) 120 MG tablet Take 120 mg by mouth daily.        . Multiple Vitamin (MULTIVITAMIN) capsule Take 1 capsule by mouth daily.        . ASMANEX 120 METERED DOSES 220 MCG/INH inhaler INHALE  2 PUFFS INTO THE LUNGS DAILY.  1 Inhaler  5  . BIOTIN PO Take 1 tablet by mouth daily.      Marland Kitchen dexlansoprazole (DEXILANT) 60 MG capsule Take 60 mg by mouth daily.      . predniSONE (DELTASONE) 10 MG tablet Take 10 mg by mouth daily. Taper as directed       No facility-administered medications prior to visit.      Review of Systems  Constitutional: Positive for diaphoresis, appetite change and fatigue. Negative for fever, chills, activity change and unexpected weight change.  HENT: Negative for hearing loss, ear pain, nosebleeds, congestion, sore throat, facial swelling, rhinorrhea, sneezing, mouth sores, trouble swallowing, neck pain, neck stiffness, dental problem, voice change, postnasal drip, sinus pressure, tinnitus and ear discharge.   Eyes: Negative for photophobia, discharge, itching and visual disturbance.  Respiratory: Positive for wheezing. Negative for apnea, cough, choking, chest tightness, shortness of breath and  stridor.   Cardiovascular: Positive for chest pain. Negative for palpitations and leg swelling.  Gastrointestinal: Negative for nausea, vomiting, abdominal pain, constipation, blood in stool and abdominal distention.  Genitourinary: Negative for dysuria, urgency, frequency, hematuria, flank pain, decreased urine volume and difficulty urinating.  Musculoskeletal: Positive for arthralgias and gait problem. Negative for myalgias, back pain and joint swelling.  Skin: Negative for color change, pallor and rash.  Neurological: Positive for numbness and headaches. Negative for dizziness, tremors, seizures, syncope, speech difficulty, weakness and light-headedness.  Hematological: Negative for adenopathy. Bruises/bleeds easily.  Psychiatric/Behavioral: Positive for agitation. Negative for confusion and sleep disturbance. The patient is nervous/anxious.       Objective:   Physical Exam  Filed Vitals:   11/02/12 1015  BP: 112/64  Pulse: 70  Temp: 98.1 F (36.7 C)  TempSrc: Oral  Height: 5' 5.5" (1.664 m)  Weight: 211 lb (95.709 kg)  SpO2: 100%    Gen: Pleasant, well-nourished, in no distress,  normal affect  ENT: No lesions,  mouth clear,  oropharynx clear, no postnasal drip  Neck: No JVD, no TMG, no carotid bruits  Lungs: No use of accessory muscles, no dullness to percussion, clear without rales or rhonchi  Cardiovascular: RRR, heart sounds normal, no murmur or gallops, no peripheral edema  Abdomen: soft and NT, no HSM,  BS normal  Musculoskeletal: No deformities, no cyanosis or clubbing  Neuro: alert, non focal  Skin: Warm, no lesions or rashes     Assessment & Plan:   Sarcoidosis Pulmonary sarcoidosis stage II Improved Plan Cont dexilant/asmanex      Updated Medication List Outpatient Encounter Prescriptions as of 11/02/2012  Medication Sig Dispense Refill  . albuterol (PROVENTIL HFA;VENTOLIN HFA) 108 (90 BASE) MCG/ACT inhaler Inhale 2 puffs into the lungs every 4  (four) hours as needed.  1 Inhaler  4  . ALPRAZolam (XANAX) 0.25 MG tablet Take 0.25 mg by mouth every 12 (twelve) hours as needed.        Marland Kitchen aspirin 81 MG tablet Take 81 mg by mouth daily.        Marland Kitchen Bioflavonoid Products (BIOFLEX PO) Take 1 tablet by mouth daily.      . chlorthalidone (HYGROTON) 25 MG tablet Take 12.5 mg by mouth daily.        Marland Kitchen CRANBERRY PO 850 mg as needed      . dexlansoprazole (DEXILANT) 60 MG capsule Take 1 capsule (60 mg total) by mouth daily.  30 capsule  11  . diltiazem (CARDIZEM) 120 MG tablet Take 120 mg by mouth  daily.        . mometasone (ASMANEX 120 METERED DOSES) 220 MCG/INH inhaler Inhale 2 puffs into the lungs daily.  1 Inhaler  11  . Multiple Vitamin (MULTIVITAMIN) capsule Take 1 capsule by mouth daily.        . [DISCONTINUED] ASMANEX 120 METERED DOSES 220 MCG/INH inhaler INHALE 2 PUFFS INTO THE LUNGS DAILY.  1 Inhaler  5  . [DISCONTINUED] BIOTIN PO Take 1 tablet by mouth daily.      . [DISCONTINUED] dexlansoprazole (DEXILANT) 60 MG capsule Take 60 mg by mouth daily.      . [DISCONTINUED] predniSONE (DELTASONE) 10 MG tablet Take 10 mg by mouth daily. Taper as directed       No facility-administered encounter medications on file as of 11/02/2012.

## 2013-03-26 ENCOUNTER — Ambulatory Visit (INDEPENDENT_AMBULATORY_CARE_PROVIDER_SITE_OTHER): Payer: PRIVATE HEALTH INSURANCE | Admitting: Critical Care Medicine

## 2013-03-26 ENCOUNTER — Encounter: Payer: Self-pay | Admitting: Critical Care Medicine

## 2013-03-26 VITALS — BP 126/88 | HR 61 | Temp 98.0°F | Ht 65.0 in | Wt 207.0 lb

## 2013-03-26 DIAGNOSIS — Z23 Encounter for immunization: Secondary | ICD-10-CM

## 2013-03-26 DIAGNOSIS — D869 Sarcoidosis, unspecified: Secondary | ICD-10-CM

## 2013-03-26 MED ORDER — MOMETASONE FUROATE 220 MCG/INH IN AEPB: 2.0000 | INHALATION_SPRAY | Freq: Two times a day (BID) | RESPIRATORY_TRACT | Status: DC

## 2013-03-26 NOTE — Assessment & Plan Note (Signed)
Pulmonary sarcoidosis with increased lower airway inflammation resulting in increased cough and dyspnea Plan Increase Asmanex 2 puffs twice daily for 2 weeks then reduce to daily thereafter Flu vaccine was administered

## 2013-03-26 NOTE — Patient Instructions (Addendum)
Take asmanex two puff twice daily until sample runs out then resume two puff daily A flu vaccine was given Ok to have the Upper GI study Return 4 months

## 2013-03-26 NOTE — Progress Notes (Signed)
Subjective:    Patient ID: Desiree Montgomery, female    DOB: [birth year], 57 y.o.   MRN: [id number]  HPI Comments: Saw Chodri 2011  for chronic cough.    Cough now has resolved with Rx of GERD, had scratchy feel to throat, neg endoscopy exam.  10/2008- 06/2009 then it gradually is better.  Still does do throat clearing. Also notes dyspnea if weather is humid.  Had to use SABA with exertion. Dx Sarcoidosis, mediastinoscopy at [facility]  LN pos for sarcoid 2010.  Rx inhaler, PFTs ? Results.  Rx pred for 12months. Then had side effects on pred slow taper to off 1.5 yrs ago per Chodri.   57 y.o. WF Hx of GERD and sarcoidosis.  No prior Dx of asthma    11/02/2012 Overall is better. No cough or wheeze.  No dyspnea. Pt denies any significant sore throat, nasal congestion or excess secretions, fever, chills, sweats, unintended weight loss, pleurtic or exertional chest pain, orthopnea PND, or leg swelling Pt denies any increase in rescue therapy over baseline, denies waking up needing it or having any early am or nocturnal exacerbations of coughing/wheezing/or dyspnea. Pt also denies any obvious fluctuation in symptoms with  weather or environmental change or other alleviating or aggravating factors  03/26/2013 Chief Complaint  Patient presents with  . Acute Visit    increased DOE x 11 days, wheezing, chest tightness, and clearing throat.     Prior sarcoid with airway involvement and cough. Rx inhaled steroid. Now: 11days of dyspnea.  No URI, just felt ran down,  Vit D level low>>50K units weekly.  Pt feels like heaviness. Saw Cardiology>>w/u neg.  Notes only dyspnea, clearing of throat but no real cough. No real mucus.  Notes more wheezing.  When ambulates with exertion had to sit down and use SABA.  PCP has pt on H2/PPI.   Past Medical History  Diagnosis Date  . Sarcoidosis   . Hypertension   . GERD (gastroesophageal reflux disease)      Family History  Problem Relation Age of Onset  . Diabetes Son    . Ovarian cancer Mother   . Colon cancer Mother   . Heart disease Mother   . Heart disease Father   . Heart disease Maternal Grandfather      History   Social History  . Marital Status: Married    Spouse Name: N/A    Number of Children: 2  . Years of Education: N/A   Occupational History  . Customer Service Rep    Social History Main Topics  . Smoking status: Never Smoker   . Smokeless tobacco: Never Used  . Alcohol Use: Yes     Comment: socially  . Drug Use: No  . Sexual Activity: Not on file   Other Topics Concern  . Not on file   Social History Narrative  . No narrative on file     Allergies  Allergen Reactions  . Ivp Dye [Iodinated Diagnostic Agents]     Dry "heaves"     Outpatient Prescriptions Prior to Visit  Medication Sig Dispense Refill  . albuterol (PROVENTIL HFA;VENTOLIN HFA) 108 (90 BASE) MCG/ACT inhaler Inhale 2 puffs into the lungs every 4 (four) hours as needed.  1 Inhaler  4  . ALPRAZolam (XANAX) 0.25 MG tablet Take 0.25 mg by mouth every 12 (twelve) hours as needed.        Marland Kitchen aspirin 81 MG tablet Take 81 mg by mouth daily.        Marland Kitchen  Bioflavonoid Products (BIOFLEX PO) Take 1 tablet by mouth daily.      . chlorthalidone (HYGROTON) 25 MG tablet Take 12.5 mg by mouth daily as needed.       Marland Kitchen CRANBERRY PO daily.       Marland Kitchen dexlansoprazole (DEXILANT) 60 MG capsule Take 1 capsule (60 mg total) by mouth daily.  30 capsule  11  . diltiazem (CARDIZEM) 120 MG tablet Take 120 mg by mouth daily.        . Multiple Vitamin (MULTIVITAMIN) capsule Take 1 capsule by mouth daily.        . mometasone (ASMANEX 120 METERED DOSES) 220 MCG/INH inhaler Inhale 2 puffs into the lungs daily.  1 Inhaler  11   No facility-administered medications prior to visit.      Review of Systems  Constitutional: Positive for diaphoresis, appetite change and fatigue. Negative for fever, chills, activity change and unexpected weight change.  HENT: Negative for hearing loss, ear pain,  nosebleeds, congestion, sore throat, facial swelling, rhinorrhea, sneezing, mouth sores, trouble swallowing, neck pain, neck stiffness, dental problem, voice change, postnasal drip, sinus pressure, tinnitus and ear discharge.   Eyes: Negative for photophobia, discharge, itching and visual disturbance.  Respiratory: Positive for wheezing. Negative for apnea, cough, choking, chest tightness, shortness of breath and stridor.   Cardiovascular: Positive for chest pain. Negative for palpitations and leg swelling.  Gastrointestinal: Negative for nausea, vomiting, abdominal pain, constipation, blood in stool and abdominal distention.  Genitourinary: Negative for dysuria, urgency, frequency, hematuria, flank pain, decreased urine volume and difficulty urinating.  Musculoskeletal: Positive for arthralgias and gait problem. Negative for myalgias, back pain and joint swelling.  Skin: Negative for color change, pallor and rash.  Neurological: Positive for numbness and headaches. Negative for dizziness, tremors, seizures, syncope, speech difficulty, weakness and light-headedness.  Hematological: Negative for adenopathy. Bruises/bleeds easily.  Psychiatric/Behavioral: Positive for agitation. Negative for confusion and sleep disturbance. The patient is nervous/anxious.       Objective:   Physical Exam  Filed Vitals:   03/26/13 0957  BP: 126/88  Pulse: 61  Temp: 98 F (36.7 C)  TempSrc: Oral  Height: 5\' 5"  (1.651 m)  Weight: 207 lb (93.895 kg)  SpO2: 99%    Gen: Pleasant, well-nourished, in no distress,  normal affect  ENT: No lesions,  mouth clear,  oropharynx clear, no postnasal drip  Neck: No JVD, no TMG, no carotid bruits  Lungs: No use of accessory muscles, no dullness to percussion, clear without rales or rhonchi  Cardiovascular: RRR, heart sounds normal, no murmur or gallops, no peripheral edema  Abdomen: soft and NT, no HSM,  BS normal  Musculoskeletal: No deformities, no cyanosis or  clubbing  Neuro: alert, non focal  Skin: Warm, no lesions or rashes     Assessment & Plan:   Sarcoidosis 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 albuterol (PROVENTIL HFA;VENTOLIN HFA) 108 (90 BASE) MCG/ACT inhaler Inhale 2 puffs into the lungs every 4 (four) hours as needed.  1 Inhaler  4  . ALPRAZolam (XANAX) 0.25 MG tablet Take 0.25 mg by mouth every 12 (twelve) hours as needed.        Marland Kitchen aspirin 81 MG tablet Take 81 mg by mouth daily.        Marland Kitchen  Bioflavonoid Products (BIOFLEX PO) Take 1 tablet by mouth daily.      . calcium-vitamin D (OSCAL WITH D) 500-200 MG-UNIT per tablet Take 1 tablet by mouth 2 (two) times daily.      . chlorthalidone (HYGROTON) 25 MG tablet Take 12.5 mg by mouth daily as needed.       Marland Kitchen CRANBERRY PO daily.       Marland Kitchen dexlansoprazole (DEXILANT) 60 MG capsule Take 1 capsule (60 mg total) by mouth daily.  30 capsule  11  . diltiazem (CARDIZEM) 120 MG tablet Take 120 mg by mouth daily.        Marland Kitchen estradiol (ESTRACE) 1 MG tablet Take 0.5 mg by mouth daily.      . fluticasone (FLONASE) 50 MCG/ACT nasal spray Place 2 sprays into the nose daily.      . mometasone (ASMANEX 120 METERED DOSES) 220 MCG/INH inhaler Inhale 2 puffs into the lungs 2 (two) times daily. Do two puff twice a day until sample runs out then two puff daily  1 Inhaler  11  . montelukast (SINGULAIR) 10 MG tablet Take 10 mg by mouth at bedtime.      . Multiple Vitamin (MULTIVITAMIN) capsule Take 1 capsule by mouth daily.        . promethazine (PHENERGAN) 12.5 MG tablet Take 12.5 mg by mouth every 8 (eight) hours as needed for nausea.      . ranitidine (ZANTAC) 150 MG capsule Take 150 mg by mouth 2 (two) times daily.       . Vitamin D, Ergocalciferol, (DRISDOL) 50000 UNITS CAPS capsule Take 50,000 Units by mouth every 7 (seven) days.      . Wheat Dextrin (BENEFIBER PO) Take by mouth daily.      . [DISCONTINUED] mometasone (ASMANEX 120 METERED DOSES) 220 MCG/INH inhaler Inhale 2 puffs into the lungs daily.  1 Inhaler  11  . cholecalciferol (VITAMIN D) 1000 UNITS tablet Take 1,000 Units by mouth daily.       No facility-administered encounter medications on file as of 03/26/2013.

## 2013-05-30 ENCOUNTER — Ambulatory Visit
Admission: RE | Admit: 2013-05-30 | Discharge: 2013-05-30 | Disposition: A | Discharge status: Home / Self Care | Payer: PRIVATE HEALTH INSURANCE | Source: Ambulatory Visit | Attending: Vascular Surgery | Admitting: Vascular Surgery

## 2013-05-30 DIAGNOSIS — Z1231 Encounter for screening mammogram for malignant neoplasm of breast: Secondary | ICD-10-CM

## 2013-06-01 ENCOUNTER — Other Ambulatory Visit: Payer: Self-pay | Admitting: Internal Medicine

## 2013-06-01 DIAGNOSIS — N23 Unspecified renal colic: Secondary | ICD-10-CM

## 2013-06-05 ENCOUNTER — Ambulatory Visit
Admission: RE | Admit: 2013-06-05 | Discharge: 2013-06-05 | Disposition: A | Discharge status: Home / Self Care | Payer: PRIVATE HEALTH INSURANCE | Source: Ambulatory Visit | Attending: Internal Medicine | Admitting: Internal Medicine

## 2013-06-05 DIAGNOSIS — N23 Unspecified renal colic: Secondary | ICD-10-CM

## 2013-06-12 ENCOUNTER — Other Ambulatory Visit: Payer: Self-pay

## 2013-06-12 DIAGNOSIS — Z1231 Encounter for screening mammogram for malignant neoplasm of breast: Secondary | ICD-10-CM

## 2013-12-25 ENCOUNTER — Other Ambulatory Visit: Payer: Self-pay | Admitting: Critical Care Medicine

## 2014-02-26 ENCOUNTER — Ambulatory Visit: Payer: PRIVATE HEALTH INSURANCE | Admitting: Critical Care Medicine

## 2014-03-07 ENCOUNTER — Ambulatory Visit: Payer: PRIVATE HEALTH INSURANCE | Admitting: Critical Care Medicine

## 2014-03-28 ENCOUNTER — Ambulatory Visit (HOSPITAL_BASED_OUTPATIENT_CLINIC_OR_DEPARTMENT_OTHER)
Admission: RE | Admit: 2014-03-28 | Discharge: 2014-03-28 | Disposition: A | Discharge status: Home / Self Care | Payer: PRIVATE HEALTH INSURANCE | Source: Ambulatory Visit | Attending: Critical Care Medicine | Admitting: Critical Care Medicine

## 2014-03-28 ENCOUNTER — Encounter: Payer: Self-pay | Admitting: Critical Care Medicine

## 2014-03-28 ENCOUNTER — Ambulatory Visit (INDEPENDENT_AMBULATORY_CARE_PROVIDER_SITE_OTHER): Payer: PRIVATE HEALTH INSURANCE | Admitting: Critical Care Medicine

## 2014-03-28 VITALS — BP 134/76 | HR 68 | Ht 65.0 in | Wt 203.0 lb

## 2014-03-28 DIAGNOSIS — D869 Sarcoidosis, unspecified: Secondary | ICD-10-CM

## 2014-03-28 DIAGNOSIS — E669 Obesity, unspecified: Secondary | ICD-10-CM

## 2014-03-28 DIAGNOSIS — J301 Allergic rhinitis due to pollen: Secondary | ICD-10-CM

## 2014-03-28 DIAGNOSIS — J309 Allergic rhinitis, unspecified: Secondary | ICD-10-CM

## 2014-03-28 HISTORY — DX: Allergic rhinitis, unspecified: J30.9

## 2014-03-28 HISTORY — DX: Obesity, unspecified: E66.9

## 2014-03-28 MED ORDER — MOMETASONE FUROATE 220 MCG/INH IN AEPB: INHALATION_SPRAY | RESPIRATORY_TRACT | Status: DC

## 2014-03-28 MED ORDER — MONTELUKAST SODIUM 10 MG PO TABS: 10.0000 mg | ORAL_TABLET | Freq: Every day | ORAL | Status: AC

## 2014-03-28 NOTE — Progress Notes (Signed)
Subjective:    Patient ID: Desiree Shellingamela D Montgomery, female    DOB: 1956/04/08, 58 y.o.   MRN: [id number]  HPI Comments: Saw Chodri 2011  for chronic cough.    Cough now has resolved with Rx of GERD, had scratchy feel to throat, neg endoscopy exam.  10/2008- 06/2009 then it gradually is better.  Still does do throat clearing. Also notes dyspnea if weather is humid.  Had to use SABA with exertion. Dx Sarcoidosis[facility]  LN pos for sarcoid 2010.  Rx inhaler, PFTs ? Results.  Rx pred for 12months. Then had side effects on pred slow taper to off 1.5 yrs ago per Chodri.   58 y.o. WF Hx of GERD and sarcoidosis.  No prior Dx of asthma  03/28/2014 Chief Complaint  Patient presents with  . Follow-up    Pt c/o sob only when it is hot, cold, or humid, some PND.  Denies cough, chest congestion.  Pt is getting flu shot through work.    Only a problem if humid air or hot.  Notes some pndrip.  No real chest pain. No real wheeze.  No real mucus.  No abx this past winter.  Notes mild pndrip     Review of Systems  Constitutional: Positive for appetite change. Negative for fever, chills, diaphoresis, activity change, fatigue and unexpected weight change.  HENT: Negative for congestion, dental problem, ear discharge, ear pain, facial swelling, hearing loss, mouth sores, nosebleeds, postnasal drip, rhinorrhea, sinus pressure, sneezing, sore throat, tinnitus, trouble swallowing and voice change.   Eyes: Negative for photophobia, discharge, itching and visual disturbance.  Respiratory: Negative for apnea, cough, choking, chest tightness, shortness of breath, wheezing and stridor.   Cardiovascular: Positive for chest pain. Negative for palpitations and leg swelling.  Gastrointestinal: Negative.  Negative for nausea, vomiting, abdominal pain, constipation, blood in stool and abdominal distention.  Genitourinary: Negative for dysuria, urgency, frequency, hematuria, flank pain, decreased urine volume and  difficulty urinating.  Musculoskeletal: Positive for arthralgias and gait problem. Negative for back pain, joint swelling, myalgias, neck pain and neck stiffness.  Skin: Negative for color change, pallor and rash.  Neurological: Positive for numbness and headaches. Negative for dizziness, tremors, seizures, syncope, speech difficulty, weakness and light-headedness.  Hematological: Negative for adenopathy. Bruises/bleeds easily.  Psychiatric/Behavioral: Positive for agitation. Negative for confusion and sleep disturbance. The patient is nervous/anxious.       Objective:   Physical Exam  Filed Vitals:   03/28/14 0901  BP: 134/76  Pulse: 68  Height: 5\' 5"  (1.651 m)  Weight: 203 lb (92.08 kg)  SpO2: 97%    Gen: Pleasant, well-nourished, in no distress,  normal affect  ENT: No lesions,  mouth clear,  oropharynx clear, no postnasal drip  Neck: No JVD, no TMG, no carotid bruits  Lungs: No use of accessory muscles, no dullness to percussion, clear without rales or rhonchi  Cardiovascular: RRR, heart sounds normal, no murmur or gallops, no peripheral edema  Abdomen: soft and NT, no HSM,  BS normal  Musculoskeletal: No deformities, no cyanosis or clubbing  Neuro: alert, non focal  Skin: Warm, no lesions or rashes     Assessment & Plan:   Sarcoidosis Sarcoidosis with lower airway inflammation rx with ICS Plan F/u CXR Cont ICS daily Asmanex Cont singulair for allergic rhinitis and prn nasal steroid Pt to get flu vaccine at work Return 12 months   Updated Medication List Outpatient Encounter Prescriptions as of 03/28/2014  Medication Sig  . albuterol (  PROVENTIL HFA;VENTOLIN HFA) 108 (90 BASE) MCG/ACT inhaler Inhale 2 puffs into the lungs every 4 (four) hours as needed.  . ALPRAZolam (XANAX) 0.25 MG tablet Take 0.25 mg by mouth every 12 (twelve) hours as needed.    Marland Kitchen aspirin 81 MG tablet Take 81 mg by mouth daily.    Marland Kitchen Bioflavonoid Products (BIOFLEX PO) Take 1 tablet by  mouth daily.  . calcium-vitamin D (OSCAL WITH D) 500-200 MG-UNIT per tablet Take 1 tablet by mouth 2 (two) times daily.  . chlorthalidone (HYGROTON) 25 MG tablet Take 12.5 mg by mouth daily as needed.   Marland Kitchen dexlansoprazole (DEXILANT) 60 MG capsule Take 1 capsule (60 mg total) by mouth daily.  Marland Kitchen diltiazem (CARDIZEM) 120 MG tablet Take 120 mg by mouth daily.    Marland Kitchen estradiol (ESTRACE) 1 MG tablet Take 0.5 mg by mouth daily.  . mometasone (ASMANEX 60 METERED DOSES) 220 MCG/INH inhaler INHALE 2 PUFFS INTO THE LUNGS DAILY.  . montelukast (SINGULAIR) 10 MG tablet Take 1 tablet (10 mg total) by mouth at bedtime.  . Multiple Vitamin (MULTIVITAMIN) capsule Take 1 capsule by mouth daily.    . promethazine (PHENERGAN) 12.5 MG tablet Take 12.5 mg by mouth every 8 (eight) hours as needed for nausea.  . ranitidine (ZANTAC) 150 MG capsule Take 150 mg by mouth 2 (two) times daily.  . Vitamin D, Ergocalciferol, (DRISDOL) 50000 UNITS CAPS capsule Take 50,000 Units by mouth every 14 (fourteen) days.   . Wheat Dextrin (BENEFIBER PO) Take by mouth daily.  . [DISCONTINUED] ASMANEX 60 METERED DOSES 220 MCG/INH inhaler INHALE 2 PUFFS INTO THE LUNGS DAILY.  . [DISCONTINUED] montelukast (SINGULAIR) 10 MG tablet Take 10 mg by mouth at bedtime.  . fluticasone (FLONASE) 50 MCG/ACT nasal spray Place 2 sprays into the nose daily.  . [DISCONTINUED] cholecalciferol (VITAMIN D) 1000 UNITS tablet Take 1,000 Units by mouth daily.  . [DISCONTINUED] CRANBERRY PO daily.   . [DISCONTINUED] mometasone (ASMANEX 120 METERED DOSES) 220 MCG/INH inhaler Inhale 2 puffs into the lungs 2 (two) times daily. Do two puff twice a day until sample runs out then two puff daily

## 2014-03-28 NOTE — Assessment & Plan Note (Signed)
Sarcoidosis with lower airway inflammation rx with ICS Plan F/u CXR Cont ICS daily Asmanex Cont singulair for allergic rhinitis and prn nasal steroid Pt to get flu vaccine at work Return 12 months

## 2014-03-28 NOTE — Patient Instructions (Signed)
Refills sent to pharmacy Chest xray today Return 12 months

## 2014-03-29 NOTE — Progress Notes (Signed)
Quick Note:  LMTCB with gentleman at pt's home # lmomtcb for pt on cell # ______

## 2014-04-01 NOTE — Progress Notes (Signed)
Quick Note:  Called, spoke with pt. Informed her of cxr results per Dr. Wright. She verbalized understanding and voiced no further questions or concerns at this time. ______ 

## 2014-05-31 ENCOUNTER — Ambulatory Visit
Admission: RE | Admit: 2014-05-31 | Discharge: 2014-05-31 | Disposition: A | Discharge status: Home / Self Care | Payer: PRIVATE HEALTH INSURANCE | Source: Ambulatory Visit

## 2014-05-31 DIAGNOSIS — Z1231 Encounter for screening mammogram for malignant neoplasm of breast: Secondary | ICD-10-CM

## 2015-04-17 ENCOUNTER — Ambulatory Visit (HOSPITAL_BASED_OUTPATIENT_CLINIC_OR_DEPARTMENT_OTHER)
Admission: RE | Admit: 2015-04-17 | Discharge: 2015-04-17 | Disposition: A | Discharge status: Home / Self Care | Payer: PRIVATE HEALTH INSURANCE | Source: Ambulatory Visit | Attending: Pulmonary Disease | Admitting: Pulmonary Disease

## 2015-04-17 ENCOUNTER — Encounter: Payer: Self-pay | Admitting: Pulmonary Disease

## 2015-04-17 ENCOUNTER — Ambulatory Visit (INDEPENDENT_AMBULATORY_CARE_PROVIDER_SITE_OTHER): Payer: PRIVATE HEALTH INSURANCE | Admitting: Pulmonary Disease

## 2015-04-17 DIAGNOSIS — R590 Localized enlarged lymph nodes: Secondary | ICD-10-CM | POA: Diagnosis not present

## 2015-04-17 DIAGNOSIS — D869 Sarcoidosis, unspecified: Secondary | ICD-10-CM | POA: Diagnosis not present

## 2015-04-17 DIAGNOSIS — R0602 Shortness of breath: Secondary | ICD-10-CM

## 2015-04-17 MED ORDER — MOMETASONE FUROATE 220 MCG/INH IN AEPB: INHALATION_SPRAY | RESPIRATORY_TRACT | Status: DC

## 2015-04-17 NOTE — Progress Notes (Signed)
   Subjective:    Patient ID: Desiree Shellingamela D Montgomery, female    DOB: 09/03/1955, 59 y.o.   MRN: [id number]  HPI 59 y.o. WF Hx of GERD and sarcoidosis. No prior Dx of asthma  [facility]osis[facility] 2010.  Rx pred for 12months. Then had side effects on pred slow taper to off 1.5 yrs ago per Chodri.  04/17/2015  Chief Complaint  Patient presents with  . Follow-up    Former PW pt; using inhaler more this summer than before; tightness in chest, some SOB.    Feels somewhat tight On asamnex daily Had to use SABA with exertion. No post nasal drip or heartburn No skin lesions Would like to avoid prednisone - had side effects incl cataract Spirometry >> nml lung function   Review of Systems neg for any significant sore throat, dysphagia, itching, sneezing, nasal congestion or excess/ purulent secretions, fever, chills, sweats, unintended wt loss, pleuritic or exertional cp, hempoptysis, orthopnea pnd or change in chronic leg swelling. Also denies presyncope, palpitations, heartburn, abdominal pain, nausea, vomiting, diarrhea or change in bowel or urinary habits, dysuria,hematuria, rash, arthralgias, visual complaints, headache, numbness weakness or ataxia.     Objective:   Physical Exam  Gen. Pleasant, well-nourished, in no distress, normal affect ENT - no lesions, no post nasal drip Neck: No JVD, no thyromegaly, no carotid bruits Lungs: no use of accessory muscles, no dullness to percussion, clear without rales or rhonchi  Cardiovascular: Rhythm regular, heart sounds  normal, no murmurs or gallops, no peripheral edema Abdomen: soft and non-tender, no hepatosplenomegaly, BS normal. Musculoskeletal: No deformities, no cyanosis or clubbing Neuro:  alert, non focal        Assessment & Plan:

## 2015-04-17 NOTE — Assessment & Plan Note (Signed)
CXR today Lung function is stable Stay on asmanex - if uneventful, consider taper off by next visit Use albuterol as needed only

## 2015-04-17 NOTE — Patient Instructions (Addendum)
CXR today Lung function is stable

## 2015-04-23 ENCOUNTER — Telehealth: Payer: Self-pay | Admitting: Pulmonary Disease

## 2015-04-23 NOTE — Telephone Encounter (Signed)
Result Notes     Notes Recorded by Karleen HampshireMichelle P Gay, CMA on 04/23/2015 at 8:42 AM lmtcb. ------  Notes Recorded by Karleen HampshireMichelle P Gay, CMA on 04/21/2015 at 2:36 PM lmtcb. ------  Notes Recorded by Oretha Milchakesh Alva V, MD on 04/21/2015 at 1:50 PM Unchanged size of lymph nodes   Pt is aware of results. Nothing further was needed.

## 2015-05-08 DIAGNOSIS — R0789 Other chest pain: Secondary | ICD-10-CM | POA: Insufficient documentation

## 2015-05-08 DIAGNOSIS — D869 Sarcoidosis, unspecified: Secondary | ICD-10-CM | POA: Insufficient documentation

## 2015-05-08 DIAGNOSIS — E785 Hyperlipidemia, unspecified: Secondary | ICD-10-CM | POA: Insufficient documentation

## 2015-05-08 HISTORY — DX: Other chest pain: R07.89

## 2015-05-08 HISTORY — DX: Hyperlipidemia, unspecified: E78.5

## 2015-06-02 DIAGNOSIS — R55 Syncope and collapse: Secondary | ICD-10-CM

## 2015-06-02 HISTORY — DX: Syncope and collapse: R55

## 2015-08-21 ENCOUNTER — Other Ambulatory Visit: Payer: Self-pay | Admitting: Internal Medicine

## 2015-08-21 DIAGNOSIS — R519 Headache, unspecified: Secondary | ICD-10-CM

## 2015-08-21 DIAGNOSIS — H538 Other visual disturbances: Secondary | ICD-10-CM

## 2015-08-21 DIAGNOSIS — R51 Headache: Principal | ICD-10-CM

## 2015-09-01 ENCOUNTER — Ambulatory Visit
Admission: RE | Admit: 2015-09-01 | Discharge: 2015-09-01 | Disposition: A | Discharge status: Home / Self Care | Payer: PRIVATE HEALTH INSURANCE | Source: Ambulatory Visit | Attending: Internal Medicine | Admitting: Internal Medicine

## 2015-09-01 DIAGNOSIS — R51 Headache: Principal | ICD-10-CM

## 2015-09-01 DIAGNOSIS — H538 Other visual disturbances: Secondary | ICD-10-CM

## 2015-09-01 DIAGNOSIS — R519 Headache, unspecified: Secondary | ICD-10-CM

## 2015-09-01 MED ORDER — GADOBENATE DIMEGLUMINE 529 MG/ML IV SOLN
20.0000 mL | Freq: Once | INTRAVENOUS | Status: AC | PRN
  Administered 2015-09-01: 20 mL via INTRAVENOUS

## 2015-09-22 ENCOUNTER — Other Ambulatory Visit: Payer: Self-pay | Admitting: Emergency Medicine

## 2015-09-22 DIAGNOSIS — R599 Enlarged lymph nodes, unspecified: Secondary | ICD-10-CM

## 2015-09-23 ENCOUNTER — Telehealth: Payer: Self-pay | Admitting: Pulmonary Disease

## 2015-09-23 NOTE — Telephone Encounter (Signed)
ATC pt x 3 at work # provided above.  Each time, line busy.  WCB.

## 2015-09-24 NOTE — Telephone Encounter (Signed)
Spoke with pt. She is aware that we will be on the look out for this disc. Nothing further was needed.

## 2015-09-24 NOTE — Telephone Encounter (Signed)
LMOVM for pt 

## 2015-09-24 NOTE — Telephone Encounter (Signed)
Pt returned call - [phone].  Pt states to ask for her.

## 2015-10-01 ENCOUNTER — Ambulatory Visit
Admission: RE | Admit: 2015-10-01 | Discharge: 2015-10-01 | Disposition: A | Discharge status: Home / Self Care | Payer: PRIVATE HEALTH INSURANCE | Source: Ambulatory Visit | Attending: Emergency Medicine | Admitting: Emergency Medicine

## 2015-10-01 ENCOUNTER — Telehealth: Payer: Self-pay | Admitting: Pulmonary Disease

## 2015-10-01 DIAGNOSIS — R599 Enlarged lymph nodes, unspecified: Secondary | ICD-10-CM

## 2015-10-01 MED ORDER — IOPAMIDOL (ISOVUE-300) INJECTION 61%
75.0000 mL | Freq: Once | INTRAVENOUS | Status: AC | PRN
  Administered 2015-10-01: 75 mL via INTRAVENOUS

## 2015-10-01 NOTE — Telephone Encounter (Signed)
LVM for pt to return call

## 2015-10-02 NOTE — Telephone Encounter (Signed)
Spoke with the pt and notified of ct chest  She states needing referral to cards b/c her neurologist wants her to have a holter monitor  Please advise if you wish to order this, or have her PCP refer  Thanks

## 2015-10-02 NOTE — Telephone Encounter (Signed)
Pt returning call.Desiree Montgomery ° °

## 2015-10-02 NOTE — Telephone Encounter (Signed)
Pt reports having a stroke on Feb 12, pt then caught a cold which turned into the flu then PNA.  Pt was seen by Urgent Care Dr Manson PasseyBrown and pt was set up for CT chest d/t enlarged lymph nodes in the past.   [facility] and she was advised to have a repeat CT in 6-8 months.  Pt would like Dr Vassie LollAlva to look at this.  Pt also requests a Cardiology referral to a local Cardiologist - pt states she so not pleased with her current Card MD d/t their billing system. Do you have any recommendations so that the patient can check with her insurance to see who is in network.  Leave a detailed message when calling back.

## 2015-10-02 NOTE — Telephone Encounter (Signed)
Reviewed CT scan No worrisome features-scarring as before and small nodule which can be followed up in 8 months  Please ask reason for cardiology referral-[facility]

## 2015-10-02 NOTE — Telephone Encounter (Signed)
lmtcb X2 for pt.  

## 2015-10-03 NOTE — Telephone Encounter (Signed)
lmomtcb for pt 

## 2015-10-03 NOTE — Telephone Encounter (Signed)
Patient returned call, [phone] (cell) or [phone] ask for Pam.

## 2015-10-03 NOTE — Telephone Encounter (Signed)
Please have PCP referred or neurologist can refer her

## 2015-10-03 NOTE — Telephone Encounter (Signed)
Called, spoke with pt.  Discussed below recs regarding cards referral per RA.  Pt verbalized understanding and will call PCP or neuro for cards referral.

## 2015-11-20 DIAGNOSIS — I639 Cerebral infarction, unspecified: Secondary | ICD-10-CM

## 2015-11-20 HISTORY — DX: Cerebral infarction, unspecified: I63.9

## 2016-04-23 ENCOUNTER — Other Ambulatory Visit: Payer: Self-pay | Admitting: Pulmonary Disease

## 2016-05-24 ENCOUNTER — Other Ambulatory Visit: Payer: Self-pay | Admitting: Pulmonary Disease

## 2016-06-14 ENCOUNTER — Other Ambulatory Visit: Payer: Self-pay | Admitting: Pulmonary Disease

## 2016-07-06 DIAGNOSIS — K219 Gastro-esophageal reflux disease without esophagitis: Secondary | ICD-10-CM | POA: Insufficient documentation

## 2017-01-20 ENCOUNTER — Ambulatory Visit (HOSPITAL_BASED_OUTPATIENT_CLINIC_OR_DEPARTMENT_OTHER)
Admission: RE | Admit: 2017-01-20 | Discharge: 2017-01-20 | Disposition: A | Discharge status: Home / Self Care | Payer: Commercial Managed Care - PPO | Source: Ambulatory Visit | Attending: Pulmonary Disease | Admitting: Pulmonary Disease

## 2017-01-20 ENCOUNTER — Ambulatory Visit (INDEPENDENT_AMBULATORY_CARE_PROVIDER_SITE_OTHER): Payer: Commercial Managed Care - PPO | Admitting: Pulmonary Disease

## 2017-01-20 ENCOUNTER — Ambulatory Visit: Payer: PRIVATE HEALTH INSURANCE | Admitting: Pulmonary Disease

## 2017-01-20 ENCOUNTER — Encounter: Payer: Self-pay | Admitting: Pulmonary Disease

## 2017-01-20 DIAGNOSIS — R05 Cough: Secondary | ICD-10-CM | POA: Diagnosis not present

## 2017-01-20 DIAGNOSIS — D869 Sarcoidosis, unspecified: Secondary | ICD-10-CM | POA: Insufficient documentation

## 2017-01-20 DIAGNOSIS — R053 Chronic cough: Secondary | ICD-10-CM

## 2017-01-20 HISTORY — DX: Chronic cough: R05.3

## 2017-01-20 NOTE — Assessment & Plan Note (Signed)
If not sarcoid, will target GERD & sinuses   STOP taking claritin Take chlorpheniramine 8 mg at bedtime instead  Continue dexilant & ranitidine for GERD

## 2017-01-20 NOTE — Patient Instructions (Signed)
STOP taking claritin Take chlorpheniramine 8 mg at bedtime instead  Continue dexilant & ranitidine for GERD  CXR today

## 2017-01-20 NOTE — Progress Notes (Signed)
   Subjective:    Patient ID: Desiree Montgomery, female    DOB: [birth year], 61 y.o.   MRN: [id number]  HPI   61 y.o. WF Hx of GERD and sarcoidosis. No prior Dx of asthma  Dx Sarcoidosis, [facility] pos for sarcoid 2010.  Rx pred for 12months. Then had side effects , pred tapered to off by 2014  Chief Complaint  Patient presents with  . Follow-up    Last seen in 2016 for SOB. States she has had a cough since last April. Productive cough with clear, white phlegm. Has not noticed a difference in the mornings and evenings.       She returns for follow-up after 2 years --   She complains of a chronic cough since April , mostly dry but occasionally stringy mucus. She has tried over-the-counter medications including Mucinex DM    Had occipital CVA Dealing with vertigo - on steroids , accl htn requiring hosp , BP  remains high  She will retire in 1 week    Significant tests/ events reviewed  04/17/2015 Spirometry >> nml lung function  CT chest 09/2015 >> borderline mediastinal and bilateral adenopathy with minimal progression from 2011    Review of Systems neg for any significant sore throat, dysphagia, itching, sneezing, nasal congestion or excess/ purulent secretions, fever, chills, sweats, unintended wt loss, pleuritic or exertional cp, hempoptysis, orthopnea pnd or change in chronic leg swelling. Also denies presyncope, palpitations, heartburn, abdominal pain, nausea, vomiting, diarrhea or change in bowel or urinary habits, dysuria,hematuria, rash, arthralgias, visual complaints, headache, numbness weakness or ataxia.     Objective:   Physical Exam   Gen. Pleasant, well-nourished, in no distress ENT - no thrush, no post nasal drip Neck: No JVD, no thyromegaly, no carotid bruits Lungs: no use of accessory muscles, no dullness to percussion, clear without rales or rhonchi  Cardiovascular: Rhythm regular, heart sounds  normal, no murmurs or gallops, no  peripheral edema Musculoskeletal: No deformities, no cyanosis or clubbing         Assessment & Plan:

## 2017-01-20 NOTE — Assessment & Plan Note (Signed)
CXR today  She has received steroid tapers & this has not worked for cough

## 2017-01-21 ENCOUNTER — Telehealth: Payer: Self-pay | Admitting: Pulmonary Disease

## 2017-01-21 MED ORDER — MOMETASONE FUROATE 220 MCG/INH IN AEPB: INHALATION_SPRAY | RESPIRATORY_TRACT | 0 refills | Status: DC

## 2017-01-21 NOTE — Telephone Encounter (Signed)
Spoke with patient-did not see any record in OV yesterday with RA to stop inhaler; sent Rx for 1 time only and will send to RA to confirm on Monday if patient should keep taking or stop Asmanex.   Please advise-will need to send additional Rx with refills is patient needs to continue Rx. Thanks.

## 2017-01-24 NOTE — Telephone Encounter (Signed)
Left message to call back  

## 2017-01-24 NOTE — Telephone Encounter (Signed)
Called and spoke with pt and she is aware of RA recs.  She is also aware of her cxr results. Nothing further is needed.

## 2017-01-24 NOTE — Telephone Encounter (Signed)
OK to stop taking asmanex Take albuterol as needed for wheezing - if worse, call us back & we can resume

## 2017-01-24 NOTE — Telephone Encounter (Signed)
Patient is returning phone call. [phone]

## 2017-02-02 ENCOUNTER — Ambulatory Visit: Payer: Commercial Managed Care - PPO | Admitting: Cardiology

## 2017-02-03 ENCOUNTER — Ambulatory Visit (INDEPENDENT_AMBULATORY_CARE_PROVIDER_SITE_OTHER): Payer: Commercial Managed Care - PPO | Admitting: Adult Health

## 2017-02-03 ENCOUNTER — Ambulatory Visit: Payer: Commercial Managed Care - PPO | Admitting: Cardiology

## 2017-02-03 ENCOUNTER — Encounter: Payer: Self-pay | Admitting: Adult Health

## 2017-02-03 DIAGNOSIS — R053 Chronic cough: Secondary | ICD-10-CM

## 2017-02-03 DIAGNOSIS — D869 Sarcoidosis, unspecified: Secondary | ICD-10-CM

## 2017-02-03 DIAGNOSIS — R05 Cough: Secondary | ICD-10-CM | POA: Diagnosis not present

## 2017-02-03 MED ORDER — ALBUTEROL SULFATE HFA 108 (90 BASE) MCG/ACT IN AERS: 2.0000 | INHALATION_SPRAY | RESPIRATORY_TRACT | 4 refills | Status: DC | PRN

## 2017-02-03 NOTE — Assessment & Plan Note (Signed)
Recent flare improved with treatment aimed at AR/GERD prevention   Plan  Patient Instructions  Continue on current regimen .  Follow up with Primary MD for elevated blood pressure.  May use Albuterol Inhaler As needed  -rescue inhaler.  Follow up with Dr. Vassie LollAlva  In 4 months and As needed

## 2017-02-03 NOTE — Patient Instructions (Signed)
Continue on current regimen .  Follow up with Primary MD for elevated blood pressure.  May use Albuterol Inhaler As needed  -rescue inhaler.  Follow up with Dr. Vassie LollAlva  In 4 months and As needed

## 2017-02-03 NOTE — Assessment & Plan Note (Signed)
Appears stable with no active flare  Cont on current regimen .

## 2017-02-03 NOTE — Progress Notes (Signed)
@Patient  ID: Desiree Montgomery, female    DOB: Jun 30, 1955, 61 y.o.   MRN: [id number]  Chief Complaint  Patient presents with  . Follow-up    Cough     Referring provider: Lucianne Lei, MD  HPI: 61 year old female never smoker followed for sarcoid Diagnosed circa doses with mediastinal endoscopy with lymph node positive for sarcoid 2010. She was treated with 12 months of prednisone and tapered off of.  TEST   04/17/2015 Spirometry >> nml lung function  CT chest 09/2015 >> borderline mediastinal and bilateral adenopathy with minimal progression from 2011   02/03/2017 Follow up : Sarcoid and cough  Patient returns for a two-week follow-up. Patient was seen last visit for a flare of her cough that had been present for 3 months. It was mainly a dry cough. She had use of Mucinex DM without much relief.. Chest x-ray was done with no acute process noted. She was started on chlorpheniramine 8 mg at bedtime. And continued on PPI and Zantac.Marland Kitchen Patient returns today feeling much improved with decreased cough. She denies any hemoptysis, chest pain, orthopnea, PND, or increased leg swelling..Rare Albuterol use.  B/p has been elevated last 2 visit. Denies HA/ext weakness or chest pain. Advised to see PCP for HTN .     Allergies  Allergen Reactions  . Ivp Dye [Iodinated Diagnostic Agents]     Dry "heaves"    Immunization History  Administered Date(s) Administered  . Hepatitis B 02/27/2015  . Influenza Split 04/13/2012, 03/28/2014  . Influenza,inj,Quad PF,36+ Mos 03/26/2013  . Influenza-Unspecified 03/29/2015  . Pneumococcal Conjugate-13 02/27/2015  . Pneumococcal Polysaccharide-23 04/13/2012  . Zoster 02/27/2015    Past Medical History:  Diagnosis Date  . GERD (gastroesophageal reflux disease)   . Hypertension   . Sarcoidosis     Tobacco History: History  Smoking Status  . Never Smoker  Smokeless Tobacco  . Never Used   Counseling given: Not Answered   Outpatient  Encounter Prescriptions as of 02/03/2017  Medication Sig  . albuterol (PROVENTIL HFA;VENTOLIN HFA) 108 (90 Base) MCG/ACT inhaler Inhale 2 puffs into the lungs every 4 (four) hours as needed.  Marland Kitchen atorvastatin (LIPITOR) 40 MG tablet Take 40 mg by mouth daily.  Marland Kitchen Bioflavonoid Products (BIOFLEX PO) Take 1 tablet by mouth daily.  . calcium-vitamin D (OSCAL WITH D) 500-200 MG-UNIT per tablet Take 1 tablet by mouth 2 (two) times daily.  . clopidogrel (PLAVIX) 75 MG tablet Take 75 mg by mouth daily.  Marland Kitchen dexlansoprazole (DEXILANT) 60 MG capsule Take 1 capsule (60 mg total) by mouth daily.  Marland Kitchen diltiazem (CARDIZEM) 60 MG tablet Take 60 mg by mouth 2 (two) times daily.  Marland Kitchen estradiol (ESTRACE) 1 MG tablet Take 0.5 mg by mouth daily.  . fluticasone (FLONASE) 50 MCG/ACT nasal spray Place 2 sprays into the nose daily.  Marland Kitchen loratadine (CLARITIN) 10 MG tablet Take 10 mg by mouth daily.  . montelukast (SINGULAIR) 10 MG tablet Take 1 tablet (10 mg total) by mouth at bedtime.  . Multiple Vitamin (MULTIVITAMIN) capsule Take 1 capsule by mouth daily.    . Vitamin D, Ergocalciferol, (DRISDOL) 50000 UNITS CAPS capsule Take 50,000 Units by mouth every 14 (fourteen) days.   . [DISCONTINUED] albuterol (PROVENTIL HFA;VENTOLIN HFA) 108 (90 BASE) MCG/ACT inhaler Inhale 2 puffs into the lungs every 4 (four) hours as needed.  . [DISCONTINUED] mometasone (ASMANEX 60 METERED DOSES) 220 MCG/INH inhaler INHALE 2 PUFFS INTO THE LUNGS DAILY.   No facility-administered encounter medications on file as  of 02/03/2017.      Review of Systems  Constitutional:   No  weight loss, night sweats,  Fevers, chills, fatigue, or  lassitude.  HEENT:   No headaches,  Difficulty swallowing,  Tooth/dental problems, or  Sore throat,                No sneezing, itching, ear ache, nasal congestion, post nasal drip,   CV:  No chest pain,  Orthopnea, PND, swelling in lower extremities, anasarca, dizziness, palpitations, syncope.   GI  No heartburn,  indigestion, abdominal pain, nausea, vomiting, diarrhea, change in bowel habits, loss of appetite, bloody stools.   Resp: No shortness of breath with exertion or at rest.  No excess mucus, no productive cough,  No non-productive cough,  No coughing up of blood.  No change in color of mucus.  No wheezing.  No chest wall deformity  Skin: no rash or lesions.  GU: no dysuria, change in color of urine, no urgency or frequency.  No flank pain, no hematuria   MS:  No joint pain or swelling.  No decreased range of motion.  No back pain.    Physical Exam  BP (!) 149/90 (BP Location: Left Arm, Patient Position: Sitting, Cuff Size: Normal)   Pulse 74   Ht 5\' 6"  (1.676 m)   Wt 203 lb (92.1 kg)   SpO2 99%   BMI 32.77 kg/m   GEN: A/Ox3; pleasant , NAD, well nourished    HEENT:  [place]/AT,  EACs-clear, TMs-wnl, NOSE-clear, THROAT-clear, no lesions, no postnasal drip or exudate noted.   NECK:  Supple w/ fair ROM; no JVD; normal carotid impulses w/o bruits; no thyromegaly or nodules palpated; no lymphadenopathy.    RESP  Clear  P & A; w/o, wheezes/ rales/ or rhonchi. no accessory muscle use, no dullness to percussion  CARD:  RRR, no m/r/g, no peripheral edema, pulses intact, no cyanosis or clubbing.  GI:   Soft & nt; nml bowel sounds; no organomegaly or masses detected.   Musco: Warm bil, no deformities or joint swelling noted.   Neuro: alert, no focal deficits noted.    Skin: Warm, no lesions or rashes    Lab Results:  CBC   BNP No results found for: BNP  ProBNP No results found for: PROBNP  Imaging: Dg Chest 2 View  Result Date: 01/20/2017 CLINICAL DATA:  None cough for 3 months, some shortness of breath, history of sarcoidosis EXAM: CHEST  2 VIEW COMPARISON:  Chest x-ray of 11/06/2015 FINDINGS: No active infiltrate or effusion is seen. There is slight nodularity of the hila which is unchanged and consistent with mild hilar adenopathy in this patient with a history of  sarcoidosis. Otherwise mediastinal and hilar contours are unremarkable. The heart is within normal limits in size. No bony abnormality is seen. IMPRESSION: Stable chest x-ray with probable mild hilar adenopathy in patient with sarcoidosis. No definite active process. Electronically Signed   By: Dwyane DeePaul  Barry M.D.   On: 01/20/2017 16:38     Assessment & Plan:   Chronic cough Recent flare improved with treatment aimed at AR/GERD prevention   Plan  Patient Instructions  Continue on current regimen .  Follow up with Primary MD for elevated blood pressure.  May use Albuterol Inhaler As needed  -rescue inhaler.  Follow up with Dr. Vassie LollAlva  In 4 months and As needed       Sarcoidosis Appears stable with no active flare  Cont on current regimen .  Rubye Oaks, NP 02/03/2017

## 2017-02-14 ENCOUNTER — Ambulatory Visit: Payer: Commercial Managed Care - PPO | Admitting: Cardiology

## 2017-02-22 ENCOUNTER — Other Ambulatory Visit: Payer: Self-pay | Admitting: Pulmonary Disease

## 2017-03-07 DIAGNOSIS — G44099 Other trigeminal autonomic cephalgias (TAC), not intractable: Secondary | ICD-10-CM | POA: Insufficient documentation

## 2017-03-07 HISTORY — DX: Other trigeminal autonomic cephalgias (tac), not intractable: G44.099

## 2017-03-10 ENCOUNTER — Ambulatory Visit: Payer: PRIVATE HEALTH INSURANCE | Admitting: Pulmonary Disease

## 2017-04-22 ENCOUNTER — Encounter: Payer: Self-pay | Admitting: Cardiology

## 2017-04-22 ENCOUNTER — Other Ambulatory Visit: Payer: Self-pay | Admitting: *Deleted

## 2017-04-22 ENCOUNTER — Ambulatory Visit (INDEPENDENT_AMBULATORY_CARE_PROVIDER_SITE_OTHER): Payer: Commercial Managed Care - PPO | Admitting: Cardiology

## 2017-04-22 VITALS — BP 138/74 | HR 56 | Resp 10 | Ht 66.0 in | Wt 195.8 lb

## 2017-04-22 DIAGNOSIS — I10 Essential (primary) hypertension: Secondary | ICD-10-CM

## 2017-04-22 DIAGNOSIS — D869 Sarcoidosis, unspecified: Secondary | ICD-10-CM

## 2017-04-22 DIAGNOSIS — Z8673 Personal history of transient ischemic attack (TIA), and cerebral infarction without residual deficits: Secondary | ICD-10-CM | POA: Diagnosis not present

## 2017-04-22 DIAGNOSIS — E785 Hyperlipidemia, unspecified: Secondary | ICD-10-CM

## 2017-04-22 DIAGNOSIS — R55 Syncope and collapse: Secondary | ICD-10-CM

## 2017-04-22 DIAGNOSIS — I639 Cerebral infarction, unspecified: Secondary | ICD-10-CM | POA: Diagnosis not present

## 2017-04-22 NOTE — Patient Instructions (Signed)
Medication Instructions:  Your physician recommends that you continue on your current medications as directed. Please refer to the Current Medication list given to you today.  1. Avoid all over-the-counter antihistamines except Claritin/Loratadine and Zyrtec/Cetrizine. 2. Avoid all combination including cold sinus allergies flu decongestant and sleep medications 3. You can use Robitussin DM Mucinex and Mucinex DM for cough. 4. can use Tylenol aspirin ibuprofen and naproxen but no combinations such as sleep or sinus.  Labwork: None   Testing/Procedures: Your physician has requested that you have a carotid duplex. This test is an ultrasound of the carotid arteries in your neck. It looks at blood flow through these arteries that supply the brain with blood. Allow one hour for this exam. There are no restrictions or special instructions.  Your physician has requested that you have an echocardiogram. Echocardiography is a painless test that uses sound waves to create images of your heart. It provides your doctor with information about the size and shape of your heart and how well your heart's chambers and valves are working. This procedure takes approximately one hour. There are no restrictions for this procedure.  Follow-Up: Your physician recommends that you schedule a follow-up appointment in: 1 month   Any Other Special Instructions Will Be Listed Below (If Applicable).  Please note that any paperwork needing to be filled out by the provider will need to be addressed at the front desk prior to seeing the provider. Please note that any paperwork FMLA, Disability or other documents regarding health condition is subject to a $25.00 charge that must be received prior to completion of paperwork in the form of a money order or check.    If you need a refill on your cardiac medications before your next appointment, please call your pharmacy.

## 2017-04-22 NOTE — Progress Notes (Signed)
Cardiology Office Note:    Date:  04/22/2017   ID:  Desiree Shellingamela D Montgomery, DOB 14-Jan-1956, MRN [id number]  PCP:  Lucianne LeiUppin, Nina, MD  Cardiologist:  Gypsy Balsamobert Krasowski, MD    Referring MD: Lucianne LeiUppin, Nina, MD   Chief Complaint  Patient presents with  . Follow-up  She is doing fair  History of Present Illness:    Desiree Shellingamela D Montgomery is a 61 y.o. female that they have seen in 2016 at that time she had some episode of near syncope and quite extensive evaluation was planned to check for possibility of heart sarcoidosis.  She did have event recorder that she wear for a month however after that she got upset because of the price of it and disappeared from follow-up we will talk about doing heart MRI however it never been done.  When she comes today to me to be reestablished.  In the meantime she is a single last time she did suffer from small occipital stroke from what she is telling me that there is no clear-cut explanation why she had a stroke.  Apparently quite extensive evaluation was done and none of this was revealing.  What is alarming to me is the fact that she described to have 2 episode of syncope last year.  Both times this episodes were fairly abrupt.  We talked at length about what to do with the situation in terms of stroke I will ask her to have echocardiogram done to check of the left atrial size.  I offered her event recorder but she is not willing to do it since previously it was very pricey to her.  I started talking to her about implantable loop recorder she said she will think it over.  I think this will be critically essential to have some monitoring device from her knee considering the fact that she got episode of syncope as well as cryptic stroke.  In the future will contemplate doing a cardiac MRI to rule out cardiac sarcoidosis.  Past Medical History:  Diagnosis Date  . GERD (gastroesophageal reflux disease)   . Hypertension   . Sarcoidosis   . Stroke [facility](HCC)     Past Surgical History:    Procedure Laterality Date  . BREAST BIOPSY  1992, 1993   x 2  . CHOLECYSTECTOMY  2009  . kidney stones    . left middle finger surgery  2008   staph infection  . MEDIASTINOSCOPY  2010   LN pos sarcoidosis  . pre cancer cells frozen off from uterus  1972  . right arm fracture    . VESICOVAGINAL FISTULA CLOSURE W/ TAH  at age 61    Current Medications: Current Meds  Medication Sig  . albuterol (PROVENTIL HFA;VENTOLIN HFA) 108 (90 Base) MCG/ACT inhaler Inhale 2 puffs into the lungs every 4 (four) hours as needed.  Marland Kitchen. atorvastatin (LIPITOR) 40 MG tablet Take 40 mg by mouth daily.  Marland Kitchen. Bioflavonoid Products (BIOFLEX PO) Take 1 tablet by mouth daily.  . calcium-vitamin D (OSCAL WITH D) 500-200 MG-UNIT per tablet Take 1 tablet by mouth 2 (two) times daily.  . clopidogrel (PLAVIX) 75 MG tablet Take 75 mg by mouth daily.  Marland Kitchen. dexlansoprazole (DEXILANT) 60 MG capsule Take 1 capsule (60 mg total) by mouth daily.  Marland Kitchen. diltiazem (DILTIAZEM CD) 180 MG 24 hr capsule Take 180 mg by mouth daily.  Marland Kitchen. estradiol (ESTRACE) 0.5 MG tablet TAKE 1 TABLET BY MOUTH IN A TAPERING FASHION AS DIRECTED  . furosemide (LASIX) 20 MG  tablet Take 0.5 tablets by mouth daily.  Marland Kitchen loratadine (CLARITIN) 10 MG tablet Take 10 mg by mouth daily.  . meclizine (ANTIVERT) 25 MG tablet Take 25 mg by mouth as needed for dizziness.  . montelukast (SINGULAIR) 10 MG tablet Take 1 tablet (10 mg total) by mouth at bedtime.  . Multiple Vitamin (MULTIVITAMIN) capsule Take 1 capsule by mouth daily.    . ondansetron (ZOFRAN) 4 MG tablet Take 1 tablet by mouth as needed.  . ranitidine (RANITIDINE 150 MAX STRENGTH) 150 MG tablet Take 150 mg by mouth 2 (two) times daily.  . Vitamin D, Ergocalciferol, (DRISDOL) 50000 UNITS CAPS capsule Take 50,000 Units by mouth every 14 (fourteen) days.      Allergies:   Ivp dye [iodinated diagnostic agents]   Social History   Social History  . Marital status: Married    Spouse name: N/A  . Number of  children: 2  . Years of education: N/A   Occupational History  . [employer]   Social History Main Topics  . Smoking status: Never Smoker  . Smokeless tobacco: Never Used  . Alcohol use Yes     Comment: socially  . Drug use: No  . Sexual activity: Not Asked   Other Topics Concern  . None   Social History Narrative  . None     Family History: The patient's family history includes Colon cancer in her mother; Diabetes in her son; Heart disease in her father, maternal grandfather, and mother; Ovarian cancer in her mother; Stroke in her mother. ROS:   Please see the history of present illness.    All 14 point review of systems negative except as described per history of present illness  EKGs/Labs/Other Studies Reviewed:      Recent Labs: No results found for requested labs within last 8760 hours.  Recent Lipid Panel No results found for: CHOL, TRIG, HDL, CHOLHDL, VLDL, LDLCALC, LDLDIRECT  Physical Exam:    VS:  BP 138/74   Pulse (!) 56   Resp 10   Ht 5\' 6"  (1.676 m)   Wt 195 lb 12.8 oz (88.8 kg)   BMI 31.60 kg/m     Wt Readings from Last 3 Encounters:  04/22/17 195 lb 12.8 oz (88.8 kg)  02/03/17 203 lb (92.1 kg)  01/20/17 206 lb (93.4 kg)     GEN:  Well nourished, well developed in no acute distress HEENT: Normal NECK: No JVD; No carotid bruits LYMPHATICS: No lymphadenopathy CARDIAC: RRR, no murmurs, no rubs, no gallops RESPIRATORY:  Clear to auscultation without rales, wheezing or rhonchi  ABDOMEN: Soft, non-tender, non-distended MUSCULOSKELETAL:  No edema; No deformity  SKIN: Warm and dry LOWER EXTREMITIES: no swelling NEUROLOGIC:  Alert and oriented x 3 PSYCHIATRIC:  Normal affect   ASSESSMENT:    1. History of CVA (cerebrovascular accident)   2. Essential hypertension   3. Occipital stroke (HCC)   4. Sarcoidosis   5. Near syncope   6. Dyslipidemia    PLAN:    In order of problems listed above:  1. History of CVA:  She is on clopidogrel which I will continue we will ask her to have carotid and vertebral ultrasound.  The concern for potential atrial fibrillation.  I will do echocardiogram to assess left atrial size.  Again I talked to her about the event recorder she is not willing to wear one but she is thinking about potentially having implantable loop recorder 2. Essential hypertension: Blood pressure appears  to be stable 3. Sarcoidosis: Followed by pulmonary apparently stable 4. New syncope: Discussion as above. 5. Dyslipidemia we will get fasting lipid profile from primary care physician   Medication Adjustments/Labs and Tests Ordered: Current medicines are reviewed at length with the patient today.  Concerns regarding medicines are outlined above.  Orders Placed This Encounter  Procedures  . ECHOCARDIOGRAM COMPLETE   Medication changes: No orders of the defined types were placed in this encounter.   Signed, Georgeanna Lea, MD, [facility] 04/22/2017 12:54 PM     [facility]

## 2017-05-17 ENCOUNTER — Ambulatory Visit (HOSPITAL_BASED_OUTPATIENT_CLINIC_OR_DEPARTMENT_OTHER)
Admission: RE | Admit: 2017-05-17 | Discharge: 2017-05-17 | Disposition: A | Discharge status: Home / Self Care | Payer: Commercial Managed Care - PPO | Source: Ambulatory Visit | Attending: Cardiology | Admitting: Cardiology

## 2017-05-17 DIAGNOSIS — Z8673 Personal history of transient ischemic attack (TIA), and cerebral infarction without residual deficits: Secondary | ICD-10-CM | POA: Diagnosis not present

## 2017-05-17 DIAGNOSIS — R079 Chest pain, unspecified: Secondary | ICD-10-CM | POA: Diagnosis not present

## 2017-05-17 DIAGNOSIS — E785 Hyperlipidemia, unspecified: Secondary | ICD-10-CM | POA: Insufficient documentation

## 2017-05-17 DIAGNOSIS — I6523 Occlusion and stenosis of bilateral carotid arteries: Secondary | ICD-10-CM | POA: Insufficient documentation

## 2017-05-17 DIAGNOSIS — I340 Nonrheumatic mitral (valve) insufficiency: Secondary | ICD-10-CM | POA: Insufficient documentation

## 2017-05-17 DIAGNOSIS — I10 Essential (primary) hypertension: Secondary | ICD-10-CM | POA: Insufficient documentation

## 2017-05-17 NOTE — Progress Notes (Signed)
Preliminary tech note: Bilateral carotid duplex exam performed, no stenosis seen.

## 2017-05-17 NOTE — Progress Notes (Signed)
Echocardiogram 2D Echocardiogram has been performed.  Desiree Montgomery, Desiree Montgomery M 05/17/2017, 11:43 AM

## 2017-05-18 LAB — VAS US CAROTID
LCCADDIAS: -22 cm/s
LCCAPSYS: 98 cm/s
LEFT ECA DIAS: -16 cm/s
LEFT VERTEBRAL DIAS: -11 cm/s
LICADSYS: -95 cm/s
LICAPSYS: -81 cm/s
Left CCA dist sys: -71 cm/s
Left CCA prox dias: 21 cm/s
Left ICA dist dias: -32 cm/s
Left ICA prox dias: -24 cm/s
RCCADSYS: -86 cm/s
RIGHT ECA DIAS: -13 cm/s
RIGHT VERTEBRAL DIAS: 13 cm/s
Right CCA prox dias: -19 cm/s
Right CCA prox sys: -83 cm/s

## 2017-05-25 ENCOUNTER — Encounter: Payer: Self-pay | Admitting: Cardiology

## 2017-05-25 ENCOUNTER — Ambulatory Visit (INDEPENDENT_AMBULATORY_CARE_PROVIDER_SITE_OTHER): Payer: Commercial Managed Care - PPO | Admitting: Cardiology

## 2017-05-25 VITALS — BP 128/80 | HR 72 | Resp 14 | Ht 66.0 in | Wt 196.4 lb

## 2017-05-25 DIAGNOSIS — E785 Hyperlipidemia, unspecified: Secondary | ICD-10-CM

## 2017-05-25 DIAGNOSIS — R0789 Other chest pain: Secondary | ICD-10-CM | POA: Diagnosis not present

## 2017-05-25 DIAGNOSIS — I10 Essential (primary) hypertension: Secondary | ICD-10-CM

## 2017-05-25 DIAGNOSIS — R55 Syncope and collapse: Secondary | ICD-10-CM | POA: Diagnosis not present

## 2017-05-25 DIAGNOSIS — D869 Sarcoidosis, unspecified: Secondary | ICD-10-CM | POA: Diagnosis not present

## 2017-05-25 NOTE — Patient Instructions (Addendum)
Medication Instructions:  Your physician recommends that you continue on your current medications as directed. Please refer to the Current Medication list given to you today.  Labwork: Your physician recommends that you have lab work today to check your cholesterol and Kidney function, liver function as well as sodium level and potassium.   Testing/Procedures: Your physician has requested that you have a cardiac MRI. Cardiac MRI uses a computer to create images of your heart as its beating, producing both still and moving pictures of your heart and major blood vessels. For further information please visit [email or web address]. Please follow the instruction sheet given to you today for more information.  Your physician has recommended that you wear an event monitor. Event monitors are medical devices that record the heart's electrical activity. Doctors most often us these monitors to diagnose arrhythmias. Arrhythmias are problems with the speed or rhythm of the heartbeat. The monitor is a small, portable device. You can wear one while you do your normal daily activities. This is usually used to diagnose what is causing palpitations/syncope (passing out).   Follow-Up: Your physician recommends that you schedule a follow-up appointment in: 6 weeks  Any Other Special Instructions Will Be Listed Below (If Applicable).  Please note that any paperwork needing to be filled out by the provider will need to be addressed at the front desk prior to seeing the provider. Please note that any paperwork FMLA, Disability or other documents regarding health condition is subject to a $25.00 charge that must be received prior to completion of paperwork in the form of a money order or check.     If you need a refill on your cardiac medications before your next appointment, please call your pharmacy.

## 2017-05-25 NOTE — Addendum Note (Signed)
Addended by: Rodney LangtonITTER, JADA M on: 05/25/2017 09:58 AM   Modules accepted: Orders

## 2017-05-25 NOTE — Addendum Note (Signed)
Addended by: Rodney LangtonITTER, Talyah Seder M on: 05/25/2017 09:50 AM   Modules accepted: Orders

## 2017-05-25 NOTE — Progress Notes (Signed)
Cardiology Office Note:    Date:  05/25/2017   ID:  Desiree Shellingamela D Brecht, DOB 1955-11-02, MRN [id number]  PCP:  Lucianne LeiUppin, Nina, MD  Cardiologist:  Gypsy Balsamobert Assyria Morreale, MD    Referring MD: Lucianne LeiUppin, Nina, MD   Chief Complaint  Patient presents with  . 1 month follow up  Doing well  History of Present Illness:    Desiree Montgomery is a 61 y.o. female described to have some sharp pain on the left side of her chest not related to exercise.  Has having dizziness or syncope, no shortness of breath, still does have chronic cough.  We talked again about potentially having implantable loop recorder she said she will think it over however she accepted offer of wearing external event recorder.  Because of some sarcoidosis episodes of syncope as well as her stroke I think it would be reasonable to do MRI of her heart to make sure there is no heart involvement.  So far echocardiogram was negative.  Past Medical History:  Diagnosis Date  . GERD (gastroesophageal reflux disease)   . Hypertension   . Sarcoidosis   . Stroke [facility](HCC)     Past Surgical History:  Procedure Laterality Date  . BREAST BIOPSY  1992, 1993   x 2  . CHOLECYSTECTOMY  2009  . kidney stones    . left middle finger surgery  2008   staph infection  . MEDIASTINOSCOPY  2010   LN pos sarcoidosis  . pre cancer cells frozen off from uterus  1972  . right arm fracture    . VESICOVAGINAL FISTULA CLOSURE W/ TAH  at age 61    Current Medications: Current Meds  Medication Sig  . albuterol (PROVENTIL HFA;VENTOLIN HFA) 108 (90 Base) MCG/ACT inhaler Inhale 2 puffs into the lungs every 4 (four) hours as needed.  Marland Kitchen. atorvastatin (LIPITOR) 40 MG tablet Take 40 mg by mouth daily.  Marland Kitchen. Bioflavonoid Products (BIOFLEX PO) Take 1 tablet by mouth daily.  . calcium-vitamin D (OSCAL WITH D) 500-200 MG-UNIT per tablet Take 1 tablet by mouth 2 (two) times daily.  . clopidogrel (PLAVIX) 75 MG tablet Take 75 mg by mouth daily.  Marland Kitchen. dexlansoprazole (DEXILANT) 60  MG capsule Take 1 capsule (60 mg total) by mouth daily.  Marland Kitchen. diltiazem (DILTIAZEM CD) 180 MG 24 hr capsule Take 180 mg by mouth daily.  . furosemide (LASIX) 20 MG tablet Take 0.5 tablets by mouth daily.  Marland Kitchen. loratadine (CLARITIN) 10 MG tablet Take 10 mg by mouth daily.  . meclizine (ANTIVERT) 25 MG tablet Take 25 mg by mouth as needed for dizziness.  . montelukast (SINGULAIR) 10 MG tablet Take 1 tablet (10 mg total) by mouth at bedtime.  . Multiple Vitamin (MULTIVITAMIN) capsule Take 1 capsule by mouth daily.    . ondansetron (ZOFRAN) 4 MG tablet Take 1 tablet by mouth as needed.  . ranitidine (RANITIDINE 150 MAX STRENGTH) 150 MG tablet Take 150 mg by mouth 2 (two) times daily.  . Vitamin D, Ergocalciferol, (DRISDOL) 50000 UNITS CAPS capsule Take 50,000 Units by mouth every 14 (fourteen) days.      Allergies:   Ivp dye [iodinated diagnostic agents]   Social History   Socioeconomic History  . Marital status: Married    Spouse name: None  . Number of children: 2  . Years of education: None  . Highest education level: None  Social Needs  . Financial resource strain: None  . Food insecurity - worry: None  . Food insecurity -  inability: None  . Transportation needs - medical: None  . Transportation needs - non-medical: None  Occupational History  . Occupation: Clinical biochemistCustomer Service Rep    Employer: [employer]  Tobacco Use  . Smoking status: Never Smoker  . Smokeless tobacco: Never Used  Substance and Sexual Activity  . Alcohol use: Yes    Comment: socially  . Drug use: No  . Sexual activity: None  Other Topics Concern  . None  Social History Narrative  . None     Family History: The patient's family history includes Colon cancer in her mother; Diabetes in her son; Heart disease in her father, maternal grandfather, and mother; Ovarian cancer in her mother; Stroke in her mother. ROS:   Please see the history of present illness.    All 14 point review of systems negative except  as described per history of present illness  EKGs/Labs/Other Studies Reviewed:      Recent Labs: No results found for requested labs within last 8760 hours.  Recent Lipid Panel No results found for: CHOL, TRIG, HDL, CHOLHDL, VLDL, LDLCALC, LDLDIRECT  Physical Exam:    VS:  BP 128/80   Pulse 72   Resp 14   Ht 5\' 6"  (1.676 m)   Wt 196 lb 6.4 oz (89.1 kg)   BMI 31.70 kg/m     Wt Readings from Last 3 Encounters:  05/25/17 196 lb 6.4 oz (89.1 kg)  04/22/17 195 lb 12.8 oz (88.8 kg)  02/03/17 203 lb (92.1 kg)     GEN:  Well nourished, well developed in no acute distress HEENT: Normal NECK: No JVD; No carotid bruits LYMPHATICS: No lymphadenopathy CARDIAC: RRR, no murmurs, no rubs, no gallops RESPIRATORY:  Clear to auscultation without rales, wheezing or rhonchi  ABDOMEN: Soft, non-tender, non-distended MUSCULOSKELETAL:  No edema; No deformity  SKIN: Warm and dry LOWER EXTREMITIES: no swelling NEUROLOGIC:  Alert and oriented x 3 PSYCHIATRIC:  Normal affect   ASSESSMENT:    1. Essential hypertension   2. Near syncope   3. Atypical chest pain   4. Sarcoidosis   5. Dyslipidemia    PLAN:    In order of problems listed above:  1. Essential hypertension: Blood pressure well controlled continue present management. 2. Near-syncope: Denies having any recent episodes.  We will ask her to wear a event recorder.  We will consider MRI of her heart to rule out sarcoidosis involvement of her heart. 3. Atypical chest pain: Very atypical and not related to exertion I doubt very much this is coronary artery disease. 4. Dyslipidemia continue with atorvastatin.   Medication Adjustments/Labs and Tests Ordered: Current medicines are reviewed at length with the patient today.  Concerns regarding medicines are outlined above.  No orders of the defined types were placed in this encounter.  Medication changes: No orders of the defined types were placed in this  encounter.   Signed, Georgeanna Leaobert J. Danielle Mink, MD, [facility] 05/25/2017 9:31 AM    [facility]

## 2017-05-26 ENCOUNTER — Telehealth: Payer: Self-pay | Admitting: Cardiology

## 2017-05-26 LAB — COMPREHENSIVE METABOLIC PANEL
A/G RATIO: 1.5 (ref 1.2–2.2)
ALT: 15 IU/L (ref 0–32)
AST: 19 IU/L (ref 0–40)
Albumin: 4.4 g/dL (ref 3.6–4.8)
Alkaline Phosphatase: 103 IU/L (ref 39–117)
BILIRUBIN TOTAL: 0.4 mg/dL (ref 0.0–1.2)
BUN/Creatinine Ratio: 11 — ABNORMAL LOW (ref 12–28)
BUN: 9 mg/dL (ref 8–27)
CALCIUM: 9.9 mg/dL (ref 8.7–10.3)
CO2: 26 mmol/L (ref 20–29)
CREATININE: 0.84 mg/dL (ref 0.57–1.00)
Chloride: 100 mmol/L (ref 96–106)
GFR calc Af Amer: 86 mL/min/{1.73_m2} (ref >59)
GFR calc non Af Amer: 75 mL/min/{1.73_m2} (ref >59)
GLOBULIN, TOTAL: 3 g/dL (ref 1.5–4.5)
Glucose: 93 mg/dL (ref 65–99)
Potassium: 4.4 mmol/L (ref 3.5–5.2)
Sodium: 141 mmol/L (ref 134–144)
TOTAL PROTEIN: 7.4 g/dL (ref 6.0–8.5)

## 2017-05-26 LAB — LIPID PANEL
CHOLESTEROL TOTAL: 165 mg/dL (ref 100–199)
Chol/HDL Ratio: 2.9 ratio (ref 0.0–4.4)
HDL: 57 mg/dL (ref >39)
LDL Calculated: 89 mg/dL (ref 0–99)
TRIGLYCERIDES: 95 mg/dL (ref 0–149)
VLDL Cholesterol Cal: 19 mg/dL (ref 5–40)

## 2017-05-26 NOTE — Telephone Encounter (Signed)
Returning call-states her results can be left on her cell phone

## 2017-05-27 ENCOUNTER — Telehealth: Payer: Self-pay | Admitting: Cardiology

## 2017-05-27 ENCOUNTER — Encounter: Payer: Self-pay | Admitting: Cardiology

## 2017-05-27 NOTE — Telephone Encounter (Signed)
Contacted patient with her results

## 2017-05-27 NOTE — Telephone Encounter (Signed)
Called the patient and gave her the date, time and location of cardiac MRI.  Letter to patient and message to the nurse.

## 2017-05-31 ENCOUNTER — Ambulatory Visit: Payer: Commercial Managed Care - PPO

## 2017-05-31 DIAGNOSIS — R55 Syncope and collapse: Secondary | ICD-10-CM

## 2017-06-01 ENCOUNTER — Telehealth: Payer: Self-pay | Admitting: Cardiology

## 2017-06-01 NOTE — Telephone Encounter (Signed)
The pad thingys don't stick good to her skin and the monitor goes off and on all night

## 2017-06-01 NOTE — Telephone Encounter (Signed)
Spoke with patient, she decided she will tryto add some tape to keep the strips on since she has only had issues last night while trying to sleep. If she continues to have issues she will contact the company.

## 2017-06-10 ENCOUNTER — Other Ambulatory Visit: Payer: Self-pay | Admitting: Cardiology

## 2017-06-10 ENCOUNTER — Ambulatory Visit (HOSPITAL_COMMUNITY)
Admission: RE | Admit: 2017-06-10 | Discharge: 2017-06-10 | Disposition: A | Discharge status: Home / Self Care | Payer: Commercial Managed Care - PPO | Source: Ambulatory Visit | Attending: Cardiology | Admitting: Cardiology

## 2017-06-10 DIAGNOSIS — D869 Sarcoidosis, unspecified: Secondary | ICD-10-CM | POA: Insufficient documentation

## 2017-06-10 DIAGNOSIS — R55 Syncope and collapse: Secondary | ICD-10-CM | POA: Insufficient documentation

## 2017-06-10 DIAGNOSIS — D8685 Sarcoid myocarditis: Secondary | ICD-10-CM | POA: Diagnosis not present

## 2017-06-10 MED ORDER — GADOBENATE DIMEGLUMINE 529 MG/ML IV SOLN
30.0000 mL | Freq: Once | INTRAVENOUS | Status: AC | PRN
  Administered 2017-06-10: 30 mL via INTRAVENOUS

## 2017-07-06 ENCOUNTER — Other Ambulatory Visit: Payer: Self-pay

## 2017-07-06 ENCOUNTER — Ambulatory Visit: Payer: Commercial Managed Care - PPO | Admitting: Cardiology

## 2017-07-06 ENCOUNTER — Ambulatory Visit (INDEPENDENT_AMBULATORY_CARE_PROVIDER_SITE_OTHER): Payer: Commercial Managed Care - PPO | Admitting: Cardiology

## 2017-07-06 ENCOUNTER — Encounter: Payer: Self-pay | Admitting: Cardiology

## 2017-07-06 VITALS — BP 128/74 | HR 75 | Ht 66.0 in | Wt 192.0 lb

## 2017-07-06 DIAGNOSIS — I10 Essential (primary) hypertension: Secondary | ICD-10-CM

## 2017-07-06 DIAGNOSIS — R55 Syncope and collapse: Secondary | ICD-10-CM

## 2017-07-06 DIAGNOSIS — E785 Hyperlipidemia, unspecified: Secondary | ICD-10-CM | POA: Diagnosis not present

## 2017-07-06 DIAGNOSIS — D869 Sarcoidosis, unspecified: Secondary | ICD-10-CM | POA: Diagnosis not present

## 2017-07-06 DIAGNOSIS — K219 Gastro-esophageal reflux disease without esophagitis: Secondary | ICD-10-CM | POA: Diagnosis not present

## 2017-07-06 NOTE — Progress Notes (Signed)
Cardiology Office Note:    Date:  07/06/2017   ID:  Desiree Montgomery, DOB 05-27-1956, MRN [id number]  PCP:  Lucianne LeiUppin, Nina, MD  Cardiologist:  Gypsy Balsamobert Arabell Neria, MD    Referring MD: Lucianne LeiUppin, Nina, MD   No chief complaint on file. Doing well  History of Present Illness:    Desiree Montgomery is a 62 y.o. female with sarcoidosis.  She had some episode of dizziness.  Quite extensive workup has been done so far which included echocardiogram which showed preserved left ventricular ejection fraction insignificant leaks from mitral and tricuspid valve: She also had MRI of her heart to look for evidence of sarcoidosis loculate was negative.  She also wear event recorder which showed only some PVC.  Overall she is doing well denies having any dizziness or syncope.  We had again discussion about the implantable loop recorder she is not keen to it.  Therefore we will continue present management and I see her back in my office in about 6 months.  Past Medical History:  Diagnosis Date  . GERD (gastroesophageal reflux disease)   . Hypertension   . Sarcoidosis   . Stroke [facility](HCC)     Past Surgical History:  Procedure Laterality Date  . BREAST BIOPSY  1992, 1993   x 2  . CHOLECYSTECTOMY  2009  . kidney stones    . left middle finger surgery  2008   staph infection  . MEDIASTINOSCOPY  2010   LN pos sarcoidosis  . pre cancer cells frozen off from uterus  1972  . right arm fracture    . VESICOVAGINAL FISTULA CLOSURE W/ TAH  at age 62    Current Medications: Current Meds  Medication Sig  . albuterol (PROVENTIL HFA;VENTOLIN HFA) 108 (90 Base) MCG/ACT inhaler Inhale 2 puffs into the lungs every 4 (four) hours as needed.  Marland Kitchen. atorvastatin (LIPITOR) 40 MG tablet Take 40 mg by mouth daily.  Marland Kitchen. Bioflavonoid Products (BIOFLEX PO) Take 1 tablet by mouth daily.  . calcium-vitamin D (OSCAL WITH D) 500-200 MG-UNIT per tablet Take 1 tablet by mouth 2 (two) times daily.  . clopidogrel (PLAVIX) 75 MG tablet Take 75 mg  by mouth daily.  Marland Kitchen. dexlansoprazole (DEXILANT) 60 MG capsule Take 1 capsule (60 mg total) by mouth daily.  Marland Kitchen. diltiazem (DILTIAZEM CD) 180 MG 24 hr capsule Take 180 mg by mouth daily.  . furosemide (LASIX) 20 MG tablet Take 0.5 tablets by mouth daily.  Marland Kitchen. loratadine (CLARITIN) 10 MG tablet Take 10 mg by mouth daily.  . meclizine (ANTIVERT) 25 MG tablet Take 25 mg by mouth as needed for dizziness.  . montelukast (SINGULAIR) 10 MG tablet Take 1 tablet (10 mg total) by mouth at bedtime.  . Multiple Vitamin (MULTIVITAMIN) capsule Take 1 capsule by mouth daily.    . ondansetron (ZOFRAN) 4 MG tablet Take 1 tablet by mouth as needed.  . ranitidine (RANITIDINE 150 MAX STRENGTH) 150 MG tablet Take 150 mg by mouth 2 (two) times daily.  . Vitamin D, Ergocalciferol, (DRISDOL) 50000 UNITS CAPS capsule Take 50,000 Units by mouth every 14 (fourteen) days.      Allergies:   Ivp dye [iodinated diagnostic agents]   Social History   Socioeconomic History  . Marital status: Married    Spouse name: None  . Number of children: 2  . Years of education: None  . Highest education level: None  Social Needs  . Financial resource strain: None  . Food insecurity - worry: None  .  Food insecurity - inability: None  . Transportation needs - medical: None  . Transportation needs - non-medical: None  Occupational History  . Occupation: Clinical biochemist Rep    Employer: [employer]  Tobacco Use  . Smoking status: Never Smoker  . Smokeless tobacco: Never Used  Substance and Sexual Activity  . Alcohol use: Yes    Comment: socially  . Drug use: No  . Sexual activity: None  Other Topics Concern  . None  Social History Narrative  . None     Family History: The patient's family history includes Colon cancer in her mother; Diabetes in her son; Heart disease in her father, maternal grandfather, and mother; Ovarian cancer in her mother; Stroke in her mother. ROS:   Please see the history of present illness.     All 14 point review of systems negative except as described per history of present illness  EKGs/Labs/Other Studies Reviewed:      Recent Labs: 05/25/2017: ALT 15; BUN 9; Creatinine, Ser 0.84; Potassium 4.4; Sodium 141  Recent Lipid Panel    Component Value Date/Time   CHOL 165 05/25/2017 1012   TRIG 95 05/25/2017 1012   HDL 57 05/25/2017 1012   CHOLHDL 2.9 05/25/2017 1012   LDLCALC 89 05/25/2017 1012    Physical Exam:    VS:  BP 128/74 (BP Location: Left Arm, Patient Position: Sitting, Cuff Size: Large)   Pulse 75   Ht 5\' 6"  (1.676 m)   Wt 192 lb (87.1 kg)   SpO2 98%   BMI 30.99 kg/m     Wt Readings from Last 3 Encounters:  07/06/17 192 lb (87.1 kg)  05/25/17 196 lb 6.4 oz (89.1 kg)  04/22/17 195 lb 12.8 oz (88.8 kg)     GEN:  Well nourished, well developed in no acute distress HEENT: Normal NECK: No JVD; No carotid bruits LYMPHATICS: No lymphadenopathy CARDIAC: RRR, no murmurs, no rubs, no gallops RESPIRATORY:  Clear to auscultation without rales, wheezing or rhonchi  ABDOMEN: Soft, non-tender, non-distended MUSCULOSKELETAL:  No edema; No deformity  SKIN: Warm and dry LOWER EXTREMITIES: no swelling NEUROLOGIC:  Alert and oriented x 3 PSYCHIATRIC:  Normal affect   ASSESSMENT:    1. Essential hypertension   2. Near syncope   3. Gastroesophageal reflux disease without esophagitis   4. Dyslipidemia   5. Sarcoidosis    PLAN:    In order of problems listed above:  1. Essential hypertension: Blood pressure well controlled continue present management. 2. Near-syncope: Denies having any issues now 3. Gastroesophageal reflux disease she described to have situation when food gets stuck in her esophagus.  I told her she need to see stomach specialist for it. 4. Dyslipidemia on statin will continue 5. Sarcoidosis: Followed by internal medicine team.  No evidence of sarcoidosis in her heart base on MRI   Medication Adjustments/Labs and Tests Ordered: Current  medicines are reviewed at length with the patient today.  Concerns regarding medicines are outlined above.  No orders of the defined types were placed in this encounter.  Medication changes: No orders of the defined types were placed in this encounter.   Signed, Georgeanna Lea, MD, [facility] 07/06/2017 11:31 AM    [facility]

## 2017-07-06 NOTE — Patient Instructions (Signed)
Medication Instructions:  Your physician recommends that you continue on your current medications as directed. Please refer to the Current Medication list given to you today.  Labwork: None ordered  Testing/Procedures: None ordered   Follow-Up: Your physician recommends that you schedule a follow-up appointment in: 6 months  Any Other Special Instructions Will Be Listed Below (If Applicable).     If you need a refill on your cardiac medications before your next appointment, please call your pharmacy.  

## 2017-07-11 DIAGNOSIS — Z1239 Encounter for other screening for malignant neoplasm of breast: Secondary | ICD-10-CM | POA: Insufficient documentation

## 2017-07-11 HISTORY — DX: Encounter for other screening for malignant neoplasm of breast: Z12.39

## 2017-07-29 DIAGNOSIS — M5481 Occipital neuralgia: Secondary | ICD-10-CM

## 2017-07-29 HISTORY — DX: Occipital neuralgia: M54.81

## 2017-11-07 ENCOUNTER — Telehealth: Payer: Self-pay | Admitting: Pulmonary Disease

## 2017-11-07 NOTE — Telephone Encounter (Signed)
Pt aware that she should speak with her PCP for measles vaccine as we do not give those in our office. Pt is aware that her PCP with go over any side effects, symptoms, etc to look for with vaccine.  Pt is heading to [place] in August.   Pt to contact her PCP today. Nothing more needed at this time.

## 2018-03-09 ENCOUNTER — Ambulatory Visit (INDEPENDENT_AMBULATORY_CARE_PROVIDER_SITE_OTHER): Payer: Commercial Managed Care - PPO | Admitting: Cardiology

## 2018-03-09 ENCOUNTER — Encounter: Payer: Self-pay | Admitting: Cardiology

## 2018-03-09 VITALS — BP 130/80 | HR 88 | Ht 66.0 in | Wt 182.6 lb

## 2018-03-09 DIAGNOSIS — D869 Sarcoidosis, unspecified: Secondary | ICD-10-CM

## 2018-03-09 DIAGNOSIS — R0789 Other chest pain: Secondary | ICD-10-CM

## 2018-03-09 DIAGNOSIS — E669 Obesity, unspecified: Secondary | ICD-10-CM | POA: Diagnosis not present

## 2018-03-09 DIAGNOSIS — I10 Essential (primary) hypertension: Secondary | ICD-10-CM | POA: Diagnosis not present

## 2018-03-09 DIAGNOSIS — E785 Hyperlipidemia, unspecified: Secondary | ICD-10-CM

## 2018-03-09 DIAGNOSIS — R55 Syncope and collapse: Secondary | ICD-10-CM

## 2018-03-09 NOTE — Progress Notes (Signed)
Cardiology Office Note:    Date:  03/09/2018   ID:  Desiree Montgomery, DOB [birth year], MRN [id number]  PCP:  Lucianne Lei, MD  Cardiologist:  Gypsy Balsam, MD    Referring MD: Lucianne Lei, MD   Chief Complaint  Patient presents with  . Follow-up  Doing well described to have some dizziness about it looks like it is of vertigo when she turns from side-to-side in the bed she will get it as many times associated with nausea and vomiting.  History of Present Illness:    Desiree Montgomery is a 62 y.o. female with sarcoidosis also history of CVA.  As well as dyslipidemia overall she is doing well but described to have rare episode of dizziness and also typically positional.  No chest pain tightness squeezing pressure burning chest she described to have stabbing sharp lasting a split second pain in the left side of her chest that have been typically at rest.  Past Medical History:  Diagnosis Date  . GERD (gastroesophageal reflux disease)   . Hypertension   . Sarcoidosis   . Stroke [facility])     Past Surgical History:  Procedure Laterality Date  . BREAST BIOPSY  1992, 1993   x 2  . CHOLECYSTECTOMY  2009  . kidney stones    . left middle finger surgery  2008   staph infection  . MEDIASTINOSCOPY  2010   LN pos sarcoidosis  . pre cancer cells frozen off from uterus  1972  . right arm fracture    . VESICOVAGINAL FISTULA CLOSURE W/ TAH  at age 70    Current Medications: Current Meds  Medication Sig  . atorvastatin (LIPITOR) 40 MG tablet Take 40 mg by mouth daily.  . calcium-vitamin D (OSCAL WITH D) 500-200 MG-UNIT per tablet Take 1 tablet by mouth 2 (two) times daily.  . clopidogrel (PLAVIX) 75 MG tablet Take 75 mg by mouth daily.  Marland Kitchen dexlansoprazole (DEXILANT) 60 MG capsule Take 1 capsule (60 mg total) by mouth daily.  Marland Kitchen diltiazem (DILTIAZEM CD) 180 MG 24 hr capsule Take 180 mg by mouth daily.  . furosemide (LASIX) 20 MG tablet Take 20 mg by mouth daily.   Marland Kitchen loratadine (CLARITIN)  10 MG tablet Take 10 mg by mouth daily.  . meclizine (ANTIVERT) 25 MG tablet Take 25 mg by mouth as needed for dizziness.  . montelukast (SINGULAIR) 10 MG tablet Take 1 tablet (10 mg total) by mouth at bedtime.  . Multiple Vitamin (MULTIVITAMIN) capsule Take 1 capsule by mouth daily.    . ondansetron (ZOFRAN) 4 MG tablet Take 1 tablet by mouth as needed.  . ranitidine (RANITIDINE 150 MAX STRENGTH) 150 MG tablet Take 150 mg by mouth 2 (two) times daily.  . Vitamin D, Ergocalciferol, (DRISDOL) 50000 UNITS CAPS capsule Take 50,000 Units by mouth every 14 (fourteen) days.      Allergies:   Ivp dye [iodinated diagnostic agents]   Social History   Socioeconomic History  . Marital status: Married    Spouse name: Not on file  . Number of children: 2  . Years of education: Not on file  . Highest education level: Not on file  Occupational History  . Occupation: Clinical biochemist Rep    Employer: [employer]  Social Needs  . Financial resource strain: Not on file  . Food insecurity:    Worry: Not on file    Inability: Not on file  . Transportation needs:    Medical: Not on file  Non-medical: Not on file  Tobacco Use  . Smoking status: Never Smoker  . Smokeless tobacco: Never Used  Substance and Sexual Activity  . Alcohol use: Yes    Comment: socially  . Drug use: No  . Sexual activity: Not on file  Lifestyle  . Physical activity:    Days per week: Not on file    Minutes per session: Not on file  . Stress: Not on file  Relationships  . Social connections:    Talks on phone: Not on file    Gets together: Not on file    Attends religious service: Not on file    Active member of club or organization: Not on file    Attends meetings of clubs or organizations: Not on file    Relationship status: Not on file  Other Topics Concern  . Not on file  Social History Narrative  . Not on file     Family History: The patient's family history includes Colon cancer in her mother;  Diabetes in her son; Heart disease in her father, maternal grandfather, and mother; Ovarian cancer in her mother; Stroke in her mother. ROS:   Please see the history of present illness.    All 14 point review of systems negative except as described per history of present illness  EKGs/Labs/Other Studies Reviewed:     EKG today showed normal sinus rhythm normal P interval normal QS complex duration morphology no ST-T segment changes  Recent Labs: 05/25/2017: ALT 15; BUN 9; Creatinine, Ser 0.84; Potassium 4.4; Sodium 141  Recent Lipid Panel    Component Value Date/Time   CHOL 165 05/25/2017 1012   TRIG 95 05/25/2017 1012   HDL 57 05/25/2017 1012   CHOLHDL 2.9 05/25/2017 1012   LDLCALC 89 05/25/2017 1012    Physical Exam:    VS:  BP 130/80   Pulse 88   Ht 5\' 6"  (1.676 m)   Wt 182 lb 9.6 oz (82.8 kg)   SpO2 99%   BMI 29.47 kg/m     Wt Readings from Last 3 Encounters:  03/09/18 182 lb 9.6 oz (82.8 kg)  07/06/17 192 lb (87.1 kg)  05/25/17 196 lb 6.4 oz (89.1 kg)     GEN:  Well nourished, well developed in no acute distress HEENT: Normal NECK: No JVD; No carotid bruits LYMPHATICS: No lymphadenopathy CARDIAC: RRR, no murmurs, no rubs, no gallops RESPIRATORY:  Clear to auscultation without rales, wheezing or rhonchi  ABDOMEN: Soft, non-tender, non-distended MUSCULOSKELETAL:  No edema; No deformity  SKIN: Warm and dry LOWER EXTREMITIES: no swelling NEUROLOGIC:  Alert and oriented x 3 PSYCHIATRIC:  Normal affect   ASSESSMENT:    1. Near syncope   2. Essential hypertension   3. Sarcoidosis   4. Obesity (BMI 30-39.9)   5. Dyslipidemia   6. Atypical chest pain    PLAN:    In order of problems listed above:  1. Near syncope denies having any.  It looks like she described today fairly typical vertigo. 2. Essential hypertension blood pressure appears to be well controlled continue present management. 3. Sarcoidosis is stable.  There is no cardiac involvement MRI of  her heart done which showed no evidence of sarcoidosis. 4. Dyslipidemia she is taking atorvastatin she is scheduled to follow-up with her primary care physician who will recheck her lipid cholesterol. 5. Atypical chest pain not related to her heart.  Overall she is doing well we were talking about potentially seeing ENT specialist for her vertigo.  She said she will think it over.   Medication Adjustments/Labs and Tests Ordered: Current medicines are reviewed at length with the patient today.  Concerns regarding medicines are outlined above.  No orders of the defined types were placed in this encounter.  Medication changes: No orders of the defined types were placed in this encounter.   Signed, Georgeanna Lea, MD, [facility] 03/09/2018 10:27 AM    [facility]

## 2018-03-09 NOTE — Patient Instructions (Signed)
Medication Instructions:  Your physician recommends that you continue on your current medications as directed. Please refer to the Current Medication list given to you today.  Labwork: None ordered  Testing/Procedures: EKG Today  Follow-Up: Your physician recommends that you schedule a follow-up appointment in: 6 month follow up with Dr. Bing MatterKrasowski. You will receive a letter or phone call to schedule   Any Other Special Instructions Will Be Listed Below (If Applicable).     If you need a refill on your cardiac medications before your next appointment, please call your pharmacy.

## 2018-04-13 ENCOUNTER — Ambulatory Visit: Payer: Commercial Managed Care - PPO | Admitting: Adult Health

## 2018-05-10 ENCOUNTER — Encounter: Payer: Self-pay | Admitting: Pulmonary Disease

## 2018-05-10 ENCOUNTER — Ambulatory Visit: Payer: Commercial Managed Care - PPO | Admitting: Pulmonary Disease

## 2018-05-10 DIAGNOSIS — D869 Sarcoidosis, unspecified: Secondary | ICD-10-CM

## 2018-05-10 DIAGNOSIS — R05 Cough: Secondary | ICD-10-CM

## 2018-05-10 DIAGNOSIS — R053 Chronic cough: Secondary | ICD-10-CM

## 2018-05-10 NOTE — Assessment & Plan Note (Signed)
This has resolved.  Current episode of cough seems to be acute and due to lower respiratory infection which is now resolving

## 2018-05-10 NOTE — Patient Instructions (Signed)
Sarcoidosis appears stable. Chest x-ray and breathing test on next visit in a year

## 2018-05-10 NOTE — Progress Notes (Signed)
   Subjective:    Patient ID: Florestine Aversamela G Katen, female    DOB: June 16, 1956, 62 y.o.   MRN: [id number]  HPI  62 yo for FU  Of sarcoidosis.   D[facility] Sarc[facility]osis[facility].  Rx pred for 12months. Then had side effects , pred tapered to off by 2014  She was well until a couple weeks ago when she developed "walking pneumonia", went to urgent care, chest x-ray was done, told this was normal initially received doxycycline and then on a follow-up visit was told she had bronchitis and given prednisone and Augmentin. She denies wheezing, coughing is improved, minimal clear sputum now  Denies skin lesions or eye symptoms  She is retired now.  Would like to defer her flu shot until PCP visit   Significant tests/ events reviewed  04/17/2015 Spirometry >> nml lung function  CT chest 09/2015 >> borderline mediastinal and bilateral adenopathy with minimal progression from 2011  Review of Systems Patient denies significant dyspnea,cough, hemoptysis,  chest pain, palpitations, pedal edema, orthopnea, paroxysmal nocturnal dyspnea, lightheadedness, nausea, vomiting, abdominal or  leg pains      Objective:   Physical Exam  Gen. Pleasant, well-nourished, in no distress ENT - no thrush, no post nasal drip Neck: No JVD, no thyromegaly, no carotid bruits Lungs: no use of accessory muscles, no dullness to percussion, clear without rales or rhonchi  Cardiovascular: Rhythm regular, heart sounds  normal, no murmurs or gallops, no peripheral edema Musculoskeletal: No deformities, no cyanosis or clubbing        Assessment & Plan:

## 2018-05-10 NOTE — Assessment & Plan Note (Signed)
Sarcoidosis appears stable. Chest x-ray and breathing test on next visit in a year 

## 2018-05-22 IMAGING — MR MR CARD MORPHOLOGY WO/W CM
9 of 11 series · 36 of 40 positions shown · IV contrast (8     MH)
Comparison: none

CLINICAL DATA: Sarcoid

EXAM:
CARDIAC MRI
TECHNIQUE: The patient was scanned on a 1.5 Tesla GE magnet. A dedicated
cardiac coil was used. Functional imaging was done using Fiesta
sequences. [DATE], and 4 chamber views were done to assess for RWMA's.
Modified Vidart rule using a short axis stack was used to
calculate an ejection fraction on a dedicated work station using
Circle software. The patient received 30 cc of Multihance. After 10
minutes inversion recovery sequences were used to assess for
infiltration and scar tissue.
CONTRAST:  30 cc Multihance

[Series 3: bSSFP · sagittal · 8.0mm · 1.41mm/px · 1 of 17 slices shown (1 of 4)]
[im 1/17]
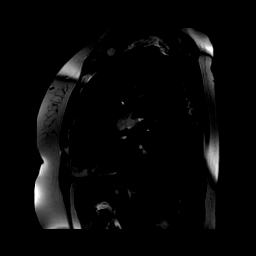

[Series 4: bSSFP · oblique · 8.0mm · 1.41mm/px · 1 of 20 slices shown (2 of 4)]
[im 1/20]
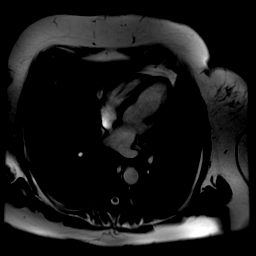

[Series 6: T1 · oblique · 8.0mm · 1.41mm/px · 1 of 14 slices shown]
[im 1/14]
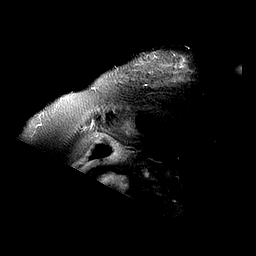

[Series 7: bSSFP · oblique · 8.0mm · 1.41mm/px · 19 of 280 slices shown (3 of 4)]
[im 1/280]
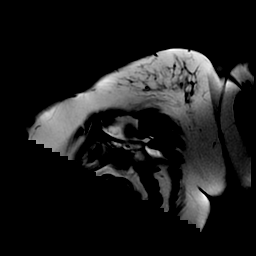
[im 16/280]
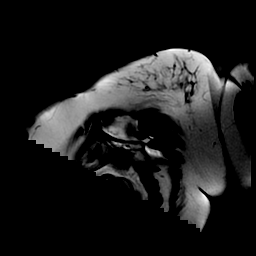
[im 32/280]
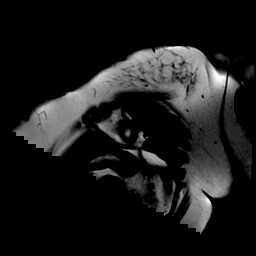
[im 47/280]
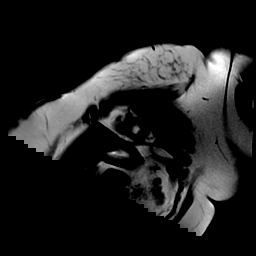
[im 63/280]
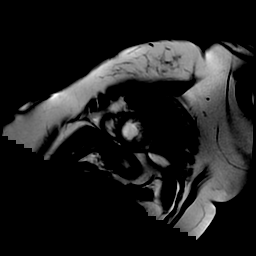
[im 78/280]
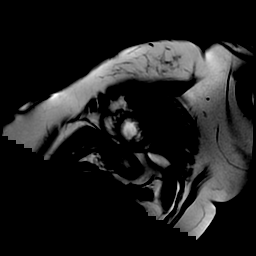
[im 94/280]
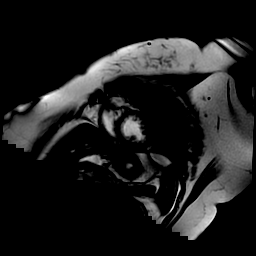
[im 109/280]
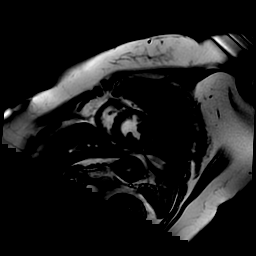
[im 125/280]
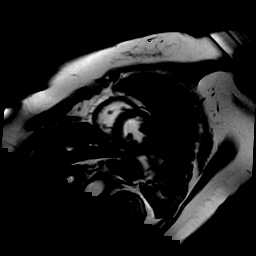
[im 140/280]
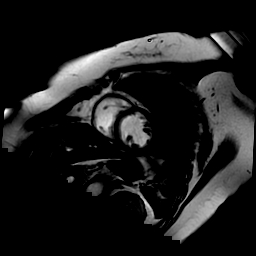
[im 156/280]
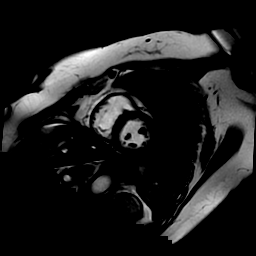
[im 171/280]
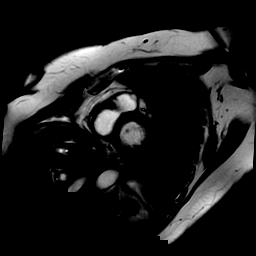
[im 187/280]
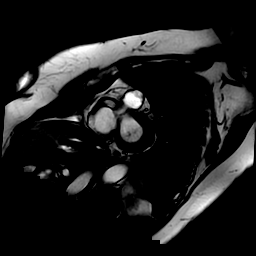
[im 202/280]
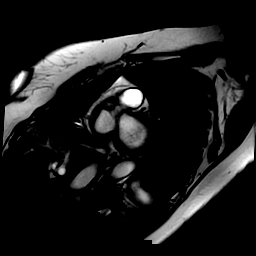
[im 218/280]
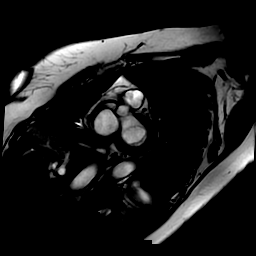
[im 233/280]
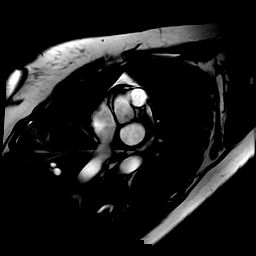
[im 249/280]
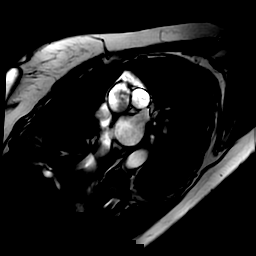
[im 264/280]
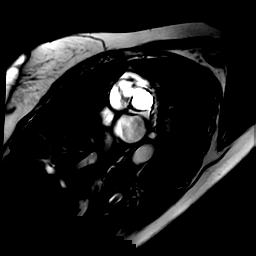
[im 280/280]
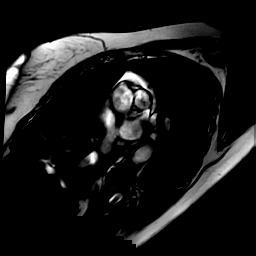

[Series 9: qpqs (id) · axial · 8.0mm · 1.48mm/px · z∈[+25,+25]mm · 4 of 60 slices shown]
[im 1/60]
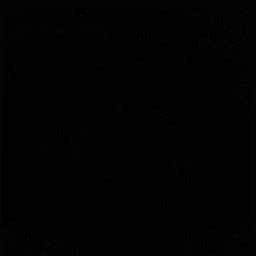
[im 20/60]
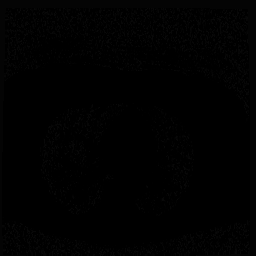
[im 40/60]
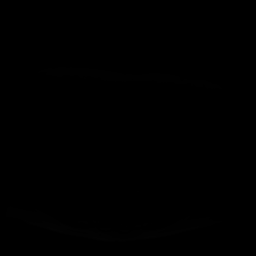
[im 60/60]
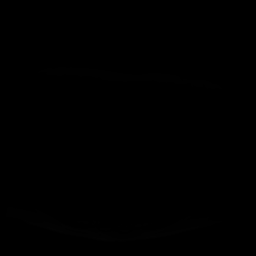

[Series 13: bSSFP · axial · 8.0mm · 1.48mm/px · z∈[-198,+211]mm · 4 of 60 slices shown (4 of 4)]
[im 1/60]
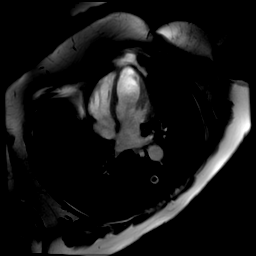
[im 20/60]
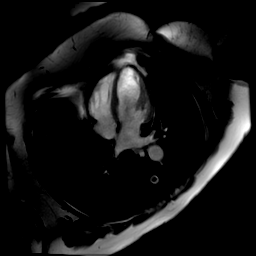
[im 40/60]
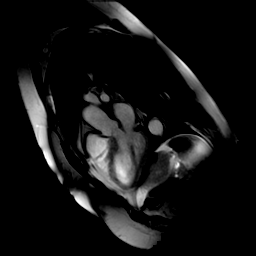
[im 60/60]
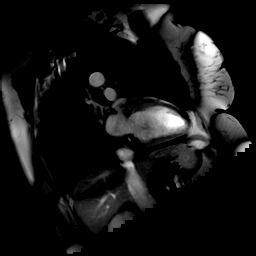

[Series 14: tags grid sa · oblique · 8.0mm · 1.41mm/px · 4 of 60 slices shown]
[im 1/60]
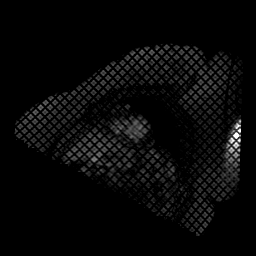
[im 20/60]
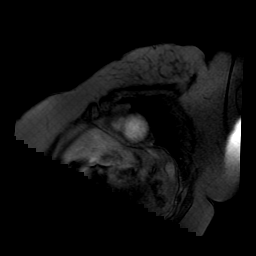
[im 40/60]
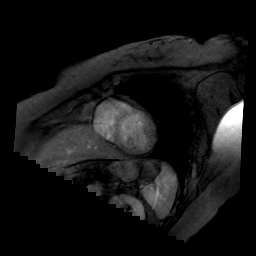
[im 60/60]
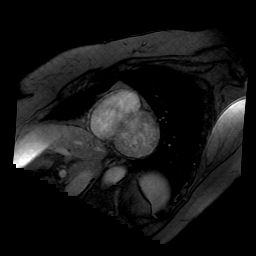

[Series 17: delayed ir prep · oblique · 8.0mm · 1.41mm/px · 1 of 10 slices shown]
[im 1/10]
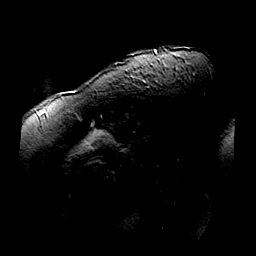

[Series 18: rad mde · axial · 8.0mm · 1.48mm/px · 1 of 3 slices shown]
[im 1/3]
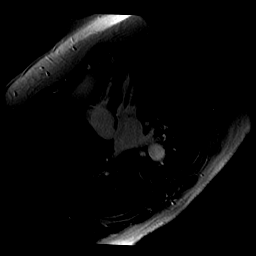

[36 of 40 positions shown; findings below may reference images not displayed]

FINDINGS: There was mild LAE. The RA/RV were normal in size and function There
was no ASD/PFO or VSD. There was no pericardial effusion. The aortic
root was normal. 3.0 cm. The AV was tri leaflet and normal

There was trivial MR. There was mild LAE with no HUY thrombus. The
LV was normal in size and function with no discrete RWMA;s The
quantitative EF was 65% (EDV 101 cc ESV 35 cc SV 66 cc) Delayed
enhancement images with gadolinium showed no uptake in the LV
myocardium
IMPRESSION: 1) Normal LV size and function EF 65%

2) No delayed gadolinium uptake on delayed inversion recovery
sequences

3) Mild LAE

4) Trivial MR

5) Normal aortic root 3.0 cm and normal tri leaflet AV

6) Normal RV size and function

7) No pericardial effusion

Vinicius Juma

## 2018-06-15 ENCOUNTER — Other Ambulatory Visit: Payer: Self-pay | Admitting: Internal Medicine

## 2018-06-15 DIAGNOSIS — I6389 Other cerebral infarction: Secondary | ICD-10-CM

## 2018-06-20 ENCOUNTER — Other Ambulatory Visit: Payer: Commercial Managed Care - PPO

## 2018-06-26 DIAGNOSIS — E559 Vitamin D deficiency, unspecified: Secondary | ICD-10-CM | POA: Insufficient documentation

## 2018-06-26 DIAGNOSIS — M8580 Other specified disorders of bone density and structure, unspecified site: Secondary | ICD-10-CM | POA: Insufficient documentation

## 2018-06-26 DIAGNOSIS — E785 Hyperlipidemia, unspecified: Secondary | ICD-10-CM

## 2018-06-26 HISTORY — DX: Vitamin D deficiency, unspecified: E55.9

## 2018-06-26 HISTORY — DX: Hyperlipidemia, unspecified: E78.5

## 2018-06-26 HISTORY — DX: Other specified disorders of bone density and structure, unspecified site: M85.80

## 2018-07-04 ENCOUNTER — Ambulatory Visit
Admission: RE | Admit: 2018-07-04 | Discharge: 2018-07-04 | Disposition: A | Discharge status: Home / Self Care | Payer: Commercial Managed Care - PPO | Source: Ambulatory Visit | Attending: Internal Medicine | Admitting: Internal Medicine

## 2018-07-04 DIAGNOSIS — I6389 Other cerebral infarction: Secondary | ICD-10-CM

## 2019-05-23 DIAGNOSIS — N2889 Other specified disorders of kidney and ureter: Secondary | ICD-10-CM

## 2019-05-23 HISTORY — DX: Other specified disorders of kidney and ureter: N28.89

## 2019-07-16 ENCOUNTER — Ambulatory Visit (INDEPENDENT_AMBULATORY_CARE_PROVIDER_SITE_OTHER): Payer: Commercial Managed Care - PPO | Admitting: Cardiology

## 2019-07-16 ENCOUNTER — Encounter: Payer: Self-pay | Admitting: Cardiology

## 2019-07-16 ENCOUNTER — Other Ambulatory Visit: Payer: Self-pay

## 2019-07-16 VITALS — BP 122/80 | HR 64 | Ht 66.0 in | Wt 173.0 lb

## 2019-07-16 DIAGNOSIS — R42 Dizziness and giddiness: Secondary | ICD-10-CM

## 2019-07-16 DIAGNOSIS — R0789 Other chest pain: Secondary | ICD-10-CM

## 2019-07-16 DIAGNOSIS — E782 Mixed hyperlipidemia: Secondary | ICD-10-CM

## 2019-07-16 DIAGNOSIS — I10 Essential (primary) hypertension: Secondary | ICD-10-CM | POA: Diagnosis not present

## 2019-07-16 HISTORY — DX: Dizziness and giddiness: R42

## 2019-07-16 NOTE — Patient Instructions (Signed)

## 2019-07-16 NOTE — Progress Notes (Signed)
Cardiology Office Note:    Date:  07/16/2019   ID:  Desiree Montgomery, DOB 1956-04-18, MRN [id number]  PCP:  Lucianne Lei, MD  Cardiologist:  Gypsy Balsam, MD    Referring MD: Lucianne Lei, MD   No chief complaint on file.   History of Present Illness:    Desiree Montgomery is a 64 y.o. female with sarcoidosis, hypertension history of CVA.  He was she was referred to [place] because of episode of near syncope but truly what she have is dizziness.  Recently some medication has been adjusted and there is no more dizziness.  I did MRI of her heart to look for any involvement of her heart with sarcoidosis which was negative.  She did also wore monitor which showed no significant arrhythmia.  She is doing very well.  She is happy taking her full grandson and she is enjoying it.  Overall doing well  Past Medical History:  Diagnosis Date  . GERD (gastroesophageal reflux disease)   . Hypertension   . Sarcoidosis   . Stroke [facility])     Past Surgical History:  Procedure Laterality Date  . BREAST BIOPSY  1992, 1993   x 2  . CHOLECYSTECTOMY  2009  . kidney stones    . left middle finger surgery  2008   staph infection  . MEDIASTINOSCOPY  2010   LN pos sarcoidosis  . pre cancer cells frozen off from uterus  1972  . right arm fracture    . VESICOVAGINAL FISTULA CLOSURE W/ TAH  at age 64    Current Medications: Current Meds  Medication Sig  . atorvastatin (LIPITOR) 40 MG tablet Take 40 mg by mouth daily.  Marland Kitchen BIOTIN PO Take by mouth daily.  . calcium-vitamin D (OSCAL WITH D) 500-200 MG-UNIT per tablet Take 1 tablet by mouth 2 (two) times daily.  . Cholecalciferol (D3 VITAMIN PO) Take by mouth daily.  . clopidogrel (PLAVIX) 75 MG tablet Take 75 mg by mouth daily.  Marland Kitchen dexlansoprazole (DEXILANT) 60 MG capsule Take 1 capsule (60 mg total) by mouth daily.  Marland Kitchen diltiazem (DILTIAZEM CD) 180 MG 24 hr capsule Take 180 mg by mouth daily.  . furosemide (LASIX) 20 MG tablet Take 20 mg by mouth daily.   .  Ginkgo Biloba 40 MG TABS Take by mouth.  . loratadine (CLARITIN) 10 MG tablet Take 10 mg by mouth daily.  . meclizine (ANTIVERT) 25 MG tablet Take 25 mg by mouth as needed for dizziness.  . montelukast (SINGULAIR) 10 MG tablet Take 1 tablet (10 mg total) by mouth at bedtime.  . Multiple Vitamin (MULTIVITAMIN) capsule Take 1 capsule by mouth daily.    . nortriptyline (PAMELOR) 25 MG capsule Take 1 capsule by mouth daily.  . ondansetron (ZOFRAN) 4 MG tablet Take 1 tablet by mouth as needed.  . Saw Palmetto, Serenoa repens, (SAW PALMETTO PO) Take by mouth daily.  . Vitamin D, Ergocalciferol, (DRISDOL) 50000 UNITS CAPS capsule Take 50,000 Units by mouth every 14 (fourteen) days.   . [DISCONTINUED] benzonatate (TESSALON) 200 MG capsule   . [DISCONTINUED] doxycycline (VIBRA-TABS) 100 MG tablet   . [DISCONTINUED] ranitidine (RANITIDINE 150 MAX STRENGTH) 150 MG tablet Take 150 mg by mouth 2 (two) times daily.     Allergies:   Ivp dye [iodinated diagnostic agents]   Social History   Socioeconomic History  . Marital status: Married    Spouse name: Not on file  . Number of children: 2  . Years of  education: Not on file  . Highest education level: Not on file  Occupational History  . Occupation: Clinical biochemist Rep    Employer: [employer]  Tobacco Use  . Smoking status: Never Smoker  . Smokeless tobacco: Never Used  Substance and Sexual Activity  . Alcohol use: Yes    Comment: socially  . Drug use: No  . Sexual activity: Not on file  Other Topics Concern  . Not on file  Social History Narrative  . Not on file   Social Determinants of Health   Financial Resource Strain:   . Difficulty of Paying Living Expenses: Not on file  Food Insecurity:   . Worried About Programme researcher, broadcasting/film/video in the Last Year: Not on file  . Ran Out of Food in the Last Year: Not on file  Transportation Needs:   . Lack of Transportation (Medical): Not on file  . Lack of Transportation (Non-Medical): Not  on file  Physical Activity:   . Days of Exercise per Week: Not on file  . Minutes of Exercise per Session: Not on file  Stress:   . Feeling of Stress : Not on file  Social Connections:   . Frequency of Communication with Friends and Family: Not on file  . Frequency of Social Gatherings with Friends and Family: Not on file  . Attends Religious Services: Not on file  . Active Member of Clubs or Organizations: Not on file  . Attends Banker Meetings: Not on file  . Marital Status: Not on file     Family History: The patient's family history includes Colon cancer in her mother; Diabetes in her son; Heart disease in her father, maternal grandfather, and mother; Ovarian cancer in her mother; Stroke in her mother. ROS:   Please see the history of present illness.    All 14 point review of systems negative except as described per history of present illness  EKGs/Labs/Other Studies Reviewed:      Recent Labs: No results found for requested labs within last 8760 hours.  Recent Lipid Panel    Component Value Date/Time   CHOL 165 05/25/2017 1012   TRIG 95 05/25/2017 1012   HDL 57 05/25/2017 1012   CHOLHDL 2.9 05/25/2017 1012   LDLCALC 89 05/25/2017 1012    Physical Exam:    VS:  BP 122/80 (BP Location: Left Arm, Patient Position: Sitting, Cuff Size: Normal)   Pulse 64   Ht 5\' 6"  (1.676 m)   Wt 173 lb (78.5 kg)   SpO2 98%   BMI 27.92 kg/m     Wt Readings from Last 3 Encounters:  07/16/19 173 lb (78.5 kg)  05/10/18 186 lb (84.4 kg)  03/09/18 182 lb 9.6 oz (82.8 kg)     GEN:  Well nourished, well developed in no acute distress HEENT: Normal NECK: No JVD; No carotid bruits LYMPHATICS: No lymphadenopathy CARDIAC: RRR, no murmurs, no rubs, no gallops RESPIRATORY:  Clear to auscultation without rales, wheezing or rhonchi  ABDOMEN: Soft, non-tender, non-distended MUSCULOSKELETAL:  No edema; No deformity  SKIN: Warm and dry LOWER EXTREMITIES: no  swelling NEUROLOGIC:  Alert and oriented x 3 PSYCHIATRIC:  Normal affect   ASSESSMENT:    1. Essential hypertension   2. Dizziness   3. Mixed hyperlipidemia   4. Atypical chest pain    PLAN:    In order of problems listed above:  1. Essential hypertension blood pressure well controlled continue present management. 2. Dizziness much improved after adjustment  of her medications. 3. Mixed dyslipidemia.  I have her cholesterol from summer of this year with LDL of 69 HDL 55 but acceptable cholesterol continue present management. 4. Atypical chest pain not related to exercise sharp stabbing lasting only very split-second.  Does not look like it is a cardiac issue.   Medication Adjustments/Labs and Tests Ordered: Current medicines are reviewed at length with the patient today.  Concerns regarding medicines are outlined above.  No orders of the defined types were placed in this encounter.  Medication changes: No orders of the defined types were placed in this encounter.   Signed, Park Liter, MD, [facility] 07/16/2019 9:48 AM    [place]

## 2019-07-23 ENCOUNTER — Ambulatory Visit: Payer: Commercial Managed Care - PPO | Admitting: Cardiology

## 2020-01-07 DIAGNOSIS — N200 Calculus of kidney: Secondary | ICD-10-CM | POA: Diagnosis not present

## 2020-02-18 DIAGNOSIS — R600 Localized edema: Secondary | ICD-10-CM | POA: Diagnosis not present

## 2020-02-18 DIAGNOSIS — J309 Allergic rhinitis, unspecified: Secondary | ICD-10-CM | POA: Diagnosis not present

## 2020-02-18 DIAGNOSIS — I10 Essential (primary) hypertension: Secondary | ICD-10-CM | POA: Diagnosis not present

## 2020-02-18 DIAGNOSIS — K219 Gastro-esophageal reflux disease without esophagitis: Secondary | ICD-10-CM | POA: Diagnosis not present

## 2020-02-18 DIAGNOSIS — E785 Hyperlipidemia, unspecified: Secondary | ICD-10-CM | POA: Diagnosis not present

## 2020-02-24 DIAGNOSIS — H15101 Unspecified episcleritis, right eye: Secondary | ICD-10-CM | POA: Diagnosis not present

## 2020-02-25 DIAGNOSIS — H524 Presbyopia: Secondary | ICD-10-CM | POA: Diagnosis not present

## 2020-04-07 DIAGNOSIS — G44099 Other trigeminal autonomic cephalgias (TAC), not intractable: Secondary | ICD-10-CM | POA: Diagnosis not present

## 2020-04-07 DIAGNOSIS — Z8673 Personal history of transient ischemic attack (TIA), and cerebral infarction without residual deficits: Secondary | ICD-10-CM | POA: Diagnosis not present

## 2020-05-21 DIAGNOSIS — J309 Allergic rhinitis, unspecified: Secondary | ICD-10-CM | POA: Diagnosis not present

## 2020-05-21 DIAGNOSIS — E785 Hyperlipidemia, unspecified: Secondary | ICD-10-CM | POA: Diagnosis not present

## 2020-05-21 DIAGNOSIS — I10 Essential (primary) hypertension: Secondary | ICD-10-CM | POA: Diagnosis not present

## 2020-05-21 DIAGNOSIS — K219 Gastro-esophageal reflux disease without esophagitis: Secondary | ICD-10-CM | POA: Diagnosis not present

## 2020-05-21 DIAGNOSIS — R600 Localized edema: Secondary | ICD-10-CM | POA: Diagnosis not present

## 2020-05-23 DIAGNOSIS — Z79899 Other long term (current) drug therapy: Secondary | ICD-10-CM | POA: Diagnosis not present

## 2020-05-23 DIAGNOSIS — E785 Hyperlipidemia, unspecified: Secondary | ICD-10-CM | POA: Diagnosis not present

## 2020-05-23 DIAGNOSIS — E559 Vitamin D deficiency, unspecified: Secondary | ICD-10-CM | POA: Diagnosis not present

## 2020-06-06 ENCOUNTER — Other Ambulatory Visit: Payer: Self-pay

## 2020-06-06 DIAGNOSIS — I10 Essential (primary) hypertension: Secondary | ICD-10-CM | POA: Insufficient documentation

## 2020-06-06 DIAGNOSIS — I639 Cerebral infarction, unspecified: Secondary | ICD-10-CM | POA: Insufficient documentation

## 2020-06-09 ENCOUNTER — Ambulatory Visit (INDEPENDENT_AMBULATORY_CARE_PROVIDER_SITE_OTHER): Payer: Medicare Other | Admitting: Cardiology

## 2020-06-09 ENCOUNTER — Encounter: Payer: Self-pay | Admitting: Cardiology

## 2020-06-09 ENCOUNTER — Other Ambulatory Visit: Payer: Self-pay

## 2020-06-09 VITALS — BP 126/76 | HR 63 | Ht 66.0 in | Wt 167.0 lb

## 2020-06-09 DIAGNOSIS — E782 Mixed hyperlipidemia: Secondary | ICD-10-CM | POA: Diagnosis not present

## 2020-06-09 DIAGNOSIS — I10 Essential (primary) hypertension: Secondary | ICD-10-CM | POA: Diagnosis not present

## 2020-06-09 DIAGNOSIS — I639 Cerebral infarction, unspecified: Secondary | ICD-10-CM | POA: Diagnosis not present

## 2020-06-09 NOTE — Progress Notes (Signed)
Cardiology Office Note:    Date:  06/09/2020   ID:  Desiree Montgomery, DOB 06/12/1956, MRN [id number]  PCP:  Lucianne Lei, MD  Cardiologist:  Gypsy Balsam, MD    Referring MD: Lucianne Lei, MD   Chief Complaint  Patient presents with  . Follow-up  M doing very well  History of Present Illness:    Desiree Montgomery is a 64 y.o. female with past medical history significant for sarcoidosis, essential hypertension, history of occipital CVA.  She was referred originally because of episode of near syncope.  However work-up has been negative.  She did have heart MRI look for potential sarcoidosis of her heart which was negative, she did wear monitor which did not show any arrhythmia.  Since I seen her last time which was almost a year ago she is doing well.  Denies have any chest pain, tightness, pressure, burning in the chest there is no syncope no dizziness.  She is taking care of her young grandchild and she is enjoying this tremendously.  Past Medical History:  Diagnosis Date  . Allergic rhinitis 03/28/2014  . Atypical chest pain 05/08/2015  . Caliectasis 05/23/2019  . Chronic cough 01/20/2017  . Dizziness 07/16/2019  . Dyslipidemia 05/08/2015  . Essential hypertension   . GERD (gastroesophageal reflux disease)   . Hyperlipidemia 06/26/2018  . Hypertension   . Near syncope 06/02/2015  . Obesity (BMI 30-39.9) 03/28/2014  . Occipital neuralgia of left side 07/29/2017  . Occipital stroke (HCC) 11/20/2015  . Osteopenia 06/26/2018  . Recurrent urinary tract infection 06/14/2007  . Sarcoidosis   . Screening breast examination 07/11/2017  . Stroke (HCC)   . 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 Vitamin D deficiency 06/26/2018    Past Surgical History:  Procedure Laterality Date  . BREAST BIOPSY  1992, 1993   x 2  . CHOLECYSTECTOMY  2009  . kidney stones    . left middle finger surgery  2008   staph infection  . MEDIASTINOSCOPY  2010   LN pos sarcoidosis  . pre cancer cells  frozen off from uterus  1972  . right arm fracture    . VESICOVAGINAL FISTULA CLOSURE W/ TAH  at age 38    Current Medications: Current Meds  Medication Sig  . atorvastatin (LIPITOR) 40 MG tablet Take 40 mg by mouth daily.  Marland Kitchen BIOTIN PO Take by mouth daily.  . calcium-vitamin D (OSCAL WITH D) 500-200 MG-UNIT per tablet Take 1 tablet by mouth 2 (two) times daily.  . Cholecalciferol (D3 VITAMIN PO) Take by mouth daily.  . Cholecalciferol (VITAMIN D3) 50 MCG (2000 UT) capsule Take 50,000 mg by mouth every 7 (seven) days.  . clopidogrel (PLAVIX) 75 MG tablet Take 75 mg by mouth daily.  Marland Kitchen dexlansoprazole (DEXILANT) 60 MG capsule Take 1 capsule (60 mg total) by mouth daily.  Marland Kitchen diltiazem (CARDIZEM CD) 180 MG 24 hr capsule Take 180 mg by mouth daily.  . furosemide (LASIX) 20 MG tablet Take 20 mg by mouth daily.   . Ginkgo Biloba 40 MG TABS Take by mouth.  . loratadine (CLARITIN) 10 MG tablet Take 10 mg by mouth daily.  . meclizine (ANTIVERT) 25 MG tablet Take 25 mg by mouth as needed for dizziness.  . montelukast (SINGULAIR) 10 MG tablet Take 1 tablet (10 mg total) by mouth at bedtime.  . Multiple Vitamin (MULTIVITAMIN ADULT PO) Take 1 tablet by mouth daily.  . nortriptyline (PAMELOR) 25 MG capsule Take 1 capsule by mouth  daily.  . ondansetron (ZOFRAN) 4 MG tablet Take 1 tablet by mouth as needed.  . pantoprazole (PROTONIX) 40 MG tablet Take 1 tablet by mouth daily.  . promethazine-dextromethorphan (PROMETHAZINE-DM) 6.25-15 MG/5ML syrup Take 6.25 mg by mouth as needed.  . ranitidine (ZANTAC) 300 MG capsule Take 300 mg by mouth daily.  . Saw Palmetto, Serenoa repens, (SAW PALMETTO PO) Take by mouth daily.     Allergies:   Ivp dye [iodinated diagnostic agents]   Social History   Socioeconomic History  . Marital status: Married    Spouse name: Not on file  . Number of children: 2  . Years of education: Not on file  . Highest education level: Not on file  Occupational History  .  Occupation: Clinical biochemist Rep    Employer: [employer]  Tobacco Use  . Smoking status: Never Smoker  . Smokeless tobacco: Never Used  Vaping Use  . Vaping Use: Never used  Substance and Sexual Activity  . Alcohol use: Yes    Comment: socially  . Drug use: No  . Sexual activity: Not on file  Other Topics Concern  . Not on file  Social History Narrative  . Not on file   Social Determinants of Health   Financial Resource Strain: Not on file  Food Insecurity: Not on file  Transportation Needs: Not on file  Physical Activity: Not on file  Stress: Not on file  Social Connections: Not on file     Family History: The patient's family history includes Colon cancer in her mother; Diabetes in her son; Heart disease in her father, maternal grandfather, and mother; Ovarian cancer in her mother; Stroke in her mother. ROS:   Please see the history of present illness.    All 14 point review of systems negative except as described per history of present illness  EKGs/Labs/Other Studies Reviewed:      Recent Labs: No results found for requested labs within last 8760 hours.  Recent Lipid Panel    Component Value Date/Time   CHOL 165 05/25/2017 1012   TRIG 95 05/25/2017 1012   HDL 57 05/25/2017 1012   CHOLHDL 2.9 05/25/2017 1012   LDLCALC 89 05/25/2017 1012    Physical Exam:    VS:  BP 126/76 (BP Location: Left Arm, Patient Position: Sitting)   Pulse 63   Ht 5\' 6"  (1.676 m)   Wt 167 lb (75.8 kg)   SpO2 99%   BMI 26.95 kg/m     Wt Readings from Last 3 Encounters:  06/09/20 167 lb (75.8 kg)  07/16/19 173 lb (78.5 kg)  05/10/18 186 lb (84.4 kg)     GEN:  Well nourished, well developed in no acute distress HEENT: Normal NECK: No JVD; No carotid bruits LYMPHATICS: No lymphadenopathy CARDIAC: RRR, no murmurs, no rubs, no gallops RESPIRATORY:  Clear to auscultation without rales, wheezing or rhonchi  ABDOMEN: Soft, non-tender, non-distended MUSCULOSKELETAL:  No  edema; No deformity  SKIN: Warm and dry LOWER EXTREMITIES: no swelling NEUROLOGIC:  Alert and oriented x 3 PSYCHIATRIC:  Normal affect   ASSESSMENT:    1. Essential hypertension   2. Occipital stroke (HCC)   3. Mixed hyperlipidemia    PLAN:    In order of problems listed above:  1. Essential hypertension: Blood pressure seems to well controlled continue present management. 2. History of occipital stroke: I did review note from neurologist, there is no new issues happening.  She is stable. 3. Mixed dyslipidemia: I did review  K PN which show me her LDL of 67 HDL 68 this is from May 23, 2020.  This is on a high intensity statin form of Lipitor 40 mg daily. 4. Overall she is doing very well.  There is no new issues.  I see her back in 1 year or sooner if she has a problem   Medication Adjustments/Labs and Tests Ordered: Current medicines are reviewed at length with the patient today.  Concerns regarding medicines are outlined above.  No orders of the defined types were placed in this encounter.  Medication changes: No orders of the defined types were placed in this encounter.   Signed, Georgeanna Lea, MD, [facility] 06/09/2020 8:35 AM    [facility]

## 2020-06-09 NOTE — Patient Instructions (Signed)

## 2020-07-28 DIAGNOSIS — Z1231 Encounter for screening mammogram for malignant neoplasm of breast: Secondary | ICD-10-CM | POA: Diagnosis not present

## 2020-08-18 DIAGNOSIS — N6011 Diffuse cystic mastopathy of right breast: Secondary | ICD-10-CM | POA: Diagnosis not present

## 2020-08-18 DIAGNOSIS — N6012 Diffuse cystic mastopathy of left breast: Secondary | ICD-10-CM | POA: Diagnosis not present

## 2020-08-20 DIAGNOSIS — E559 Vitamin D deficiency, unspecified: Secondary | ICD-10-CM | POA: Diagnosis not present

## 2020-08-20 DIAGNOSIS — R600 Localized edema: Secondary | ICD-10-CM | POA: Diagnosis not present

## 2020-08-20 DIAGNOSIS — R413 Other amnesia: Secondary | ICD-10-CM | POA: Diagnosis not present

## 2020-08-20 DIAGNOSIS — I10 Essential (primary) hypertension: Secondary | ICD-10-CM | POA: Diagnosis not present

## 2020-08-20 DIAGNOSIS — Z79899 Other long term (current) drug therapy: Secondary | ICD-10-CM | POA: Diagnosis not present

## 2020-08-20 DIAGNOSIS — K219 Gastro-esophageal reflux disease without esophagitis: Secondary | ICD-10-CM | POA: Diagnosis not present

## 2020-08-20 DIAGNOSIS — E785 Hyperlipidemia, unspecified: Secondary | ICD-10-CM | POA: Diagnosis not present

## 2020-08-20 DIAGNOSIS — J309 Allergic rhinitis, unspecified: Secondary | ICD-10-CM | POA: Diagnosis not present

## 2020-08-20 DIAGNOSIS — Z9181 History of falling: Secondary | ICD-10-CM | POA: Diagnosis not present

## 2020-11-06 DIAGNOSIS — S8264XA Nondisplaced fracture of lateral malleolus of right fibula, initial encounter for closed fracture: Secondary | ICD-10-CM | POA: Diagnosis not present

## 2020-11-06 DIAGNOSIS — S93401A Sprain of unspecified ligament of right ankle, initial encounter: Secondary | ICD-10-CM | POA: Diagnosis not present

## 2020-11-10 DIAGNOSIS — S8264XA Nondisplaced fracture of lateral malleolus of right fibula, initial encounter for closed fracture: Secondary | ICD-10-CM | POA: Diagnosis not present

## 2020-11-12 DIAGNOSIS — E785 Hyperlipidemia, unspecified: Secondary | ICD-10-CM | POA: Diagnosis not present

## 2020-11-12 DIAGNOSIS — R600 Localized edema: Secondary | ICD-10-CM | POA: Diagnosis not present

## 2020-11-12 DIAGNOSIS — I10 Essential (primary) hypertension: Secondary | ICD-10-CM | POA: Diagnosis not present

## 2020-11-12 DIAGNOSIS — S82891A Other fracture of right lower leg, initial encounter for closed fracture: Secondary | ICD-10-CM | POA: Diagnosis not present

## 2020-11-12 DIAGNOSIS — E559 Vitamin D deficiency, unspecified: Secondary | ICD-10-CM | POA: Diagnosis not present

## 2020-11-12 DIAGNOSIS — R413 Other amnesia: Secondary | ICD-10-CM | POA: Diagnosis not present

## 2020-11-12 DIAGNOSIS — J309 Allergic rhinitis, unspecified: Secondary | ICD-10-CM | POA: Diagnosis not present

## 2020-11-12 DIAGNOSIS — Z9181 History of falling: Secondary | ICD-10-CM | POA: Diagnosis not present

## 2020-11-12 DIAGNOSIS — K219 Gastro-esophageal reflux disease without esophagitis: Secondary | ICD-10-CM | POA: Diagnosis not present

## 2020-11-12 DIAGNOSIS — Z79899 Other long term (current) drug therapy: Secondary | ICD-10-CM | POA: Diagnosis not present

## 2020-11-17 DIAGNOSIS — S8264XD Nondisplaced fracture of lateral malleolus of right fibula, subsequent encounter for closed fracture with routine healing: Secondary | ICD-10-CM | POA: Diagnosis not present

## 2020-11-17 DIAGNOSIS — S82261D Displaced segmental fracture of shaft of right tibia, subsequent encounter for closed fracture with routine healing: Secondary | ICD-10-CM | POA: Diagnosis not present

## 2020-12-15 DIAGNOSIS — S8264XD Nondisplaced fracture of lateral malleolus of right fibula, subsequent encounter for closed fracture with routine healing: Secondary | ICD-10-CM | POA: Diagnosis not present

## 2021-01-06 DIAGNOSIS — N200 Calculus of kidney: Secondary | ICD-10-CM | POA: Diagnosis not present

## 2021-01-13 DIAGNOSIS — M25571 Pain in right ankle and joints of right foot: Secondary | ICD-10-CM | POA: Diagnosis not present

## 2021-01-13 DIAGNOSIS — M62838 Other muscle spasm: Secondary | ICD-10-CM | POA: Diagnosis not present

## 2021-01-13 DIAGNOSIS — M6281 Muscle weakness (generalized): Secondary | ICD-10-CM | POA: Diagnosis not present

## 2021-01-15 DIAGNOSIS — S8264XD Nondisplaced fracture of lateral malleolus of right fibula, subsequent encounter for closed fracture with routine healing: Secondary | ICD-10-CM | POA: Diagnosis not present

## 2021-01-19 DIAGNOSIS — M25571 Pain in right ankle and joints of right foot: Secondary | ICD-10-CM | POA: Diagnosis not present

## 2021-01-19 DIAGNOSIS — M6281 Muscle weakness (generalized): Secondary | ICD-10-CM | POA: Diagnosis not present

## 2021-01-19 DIAGNOSIS — M62838 Other muscle spasm: Secondary | ICD-10-CM | POA: Diagnosis not present

## 2021-02-16 DIAGNOSIS — R413 Other amnesia: Secondary | ICD-10-CM | POA: Diagnosis not present

## 2021-02-16 DIAGNOSIS — J309 Allergic rhinitis, unspecified: Secondary | ICD-10-CM | POA: Diagnosis not present

## 2021-02-16 DIAGNOSIS — I10 Essential (primary) hypertension: Secondary | ICD-10-CM | POA: Diagnosis not present

## 2021-02-16 DIAGNOSIS — R600 Localized edema: Secondary | ICD-10-CM | POA: Diagnosis not present

## 2021-02-16 DIAGNOSIS — K219 Gastro-esophageal reflux disease without esophagitis: Secondary | ICD-10-CM | POA: Diagnosis not present

## 2021-02-16 DIAGNOSIS — S82891A Other fracture of right lower leg, initial encounter for closed fracture: Secondary | ICD-10-CM | POA: Diagnosis not present

## 2021-02-16 DIAGNOSIS — Z9181 History of falling: Secondary | ICD-10-CM | POA: Diagnosis not present

## 2021-02-16 DIAGNOSIS — E785 Hyperlipidemia, unspecified: Secondary | ICD-10-CM | POA: Diagnosis not present

## 2021-02-16 DIAGNOSIS — E559 Vitamin D deficiency, unspecified: Secondary | ICD-10-CM | POA: Diagnosis not present

## 2021-02-16 DIAGNOSIS — Z79899 Other long term (current) drug therapy: Secondary | ICD-10-CM | POA: Diagnosis not present

## 2021-03-24 DIAGNOSIS — Z20822 Contact with and (suspected) exposure to covid-19: Secondary | ICD-10-CM | POA: Diagnosis not present

## 2021-03-24 DIAGNOSIS — Z03818 Encounter for observation for suspected exposure to other biological agents ruled out: Secondary | ICD-10-CM | POA: Diagnosis not present

## 2021-03-24 DIAGNOSIS — R059 Cough, unspecified: Secondary | ICD-10-CM | POA: Diagnosis not present

## 2021-03-24 DIAGNOSIS — J069 Acute upper respiratory infection, unspecified: Secondary | ICD-10-CM | POA: Diagnosis not present

## 2021-03-24 DIAGNOSIS — I10 Essential (primary) hypertension: Secondary | ICD-10-CM | POA: Diagnosis not present

## 2021-04-04 DIAGNOSIS — J440 Chronic obstructive pulmonary disease with acute lower respiratory infection: Secondary | ICD-10-CM | POA: Diagnosis not present

## 2021-05-25 DIAGNOSIS — R0981 Nasal congestion: Secondary | ICD-10-CM | POA: Diagnosis not present

## 2021-05-25 DIAGNOSIS — J209 Acute bronchitis, unspecified: Secondary | ICD-10-CM | POA: Diagnosis not present

## 2021-05-27 DIAGNOSIS — R600 Localized edema: Secondary | ICD-10-CM | POA: Diagnosis not present

## 2021-05-27 DIAGNOSIS — R413 Other amnesia: Secondary | ICD-10-CM | POA: Diagnosis not present

## 2021-05-27 DIAGNOSIS — Z79899 Other long term (current) drug therapy: Secondary | ICD-10-CM | POA: Diagnosis not present

## 2021-05-27 DIAGNOSIS — K219 Gastro-esophageal reflux disease without esophagitis: Secondary | ICD-10-CM | POA: Diagnosis not present

## 2021-05-27 DIAGNOSIS — I10 Essential (primary) hypertension: Secondary | ICD-10-CM | POA: Diagnosis not present

## 2021-05-27 DIAGNOSIS — E785 Hyperlipidemia, unspecified: Secondary | ICD-10-CM | POA: Diagnosis not present

## 2021-05-27 DIAGNOSIS — J309 Allergic rhinitis, unspecified: Secondary | ICD-10-CM | POA: Diagnosis not present

## 2021-05-27 DIAGNOSIS — J069 Acute upper respiratory infection, unspecified: Secondary | ICD-10-CM | POA: Diagnosis not present

## 2021-05-27 DIAGNOSIS — E559 Vitamin D deficiency, unspecified: Secondary | ICD-10-CM | POA: Diagnosis not present

## 2021-05-29 DIAGNOSIS — Z79899 Other long term (current) drug therapy: Secondary | ICD-10-CM | POA: Diagnosis not present

## 2021-05-29 DIAGNOSIS — E559 Vitamin D deficiency, unspecified: Secondary | ICD-10-CM | POA: Diagnosis not present

## 2021-05-29 DIAGNOSIS — E785 Hyperlipidemia, unspecified: Secondary | ICD-10-CM | POA: Diagnosis not present

## 2021-06-23 DIAGNOSIS — M810 Age-related osteoporosis without current pathological fracture: Secondary | ICD-10-CM | POA: Diagnosis not present

## 2021-06-23 DIAGNOSIS — K5900 Constipation, unspecified: Secondary | ICD-10-CM | POA: Diagnosis not present

## 2021-06-23 DIAGNOSIS — Z1239 Encounter for other screening for malignant neoplasm of breast: Secondary | ICD-10-CM | POA: Diagnosis not present

## 2021-06-24 DIAGNOSIS — E785 Hyperlipidemia, unspecified: Secondary | ICD-10-CM | POA: Diagnosis not present

## 2021-06-24 DIAGNOSIS — Z9181 History of falling: Secondary | ICD-10-CM | POA: Diagnosis not present

## 2021-06-24 DIAGNOSIS — Z23 Encounter for immunization: Secondary | ICD-10-CM | POA: Diagnosis not present

## 2021-06-24 DIAGNOSIS — Z0000 Encounter for general adult medical examination without abnormal findings: Secondary | ICD-10-CM | POA: Diagnosis not present

## 2021-07-03 DIAGNOSIS — Z7902 Long term (current) use of antithrombotics/antiplatelets: Secondary | ICD-10-CM | POA: Diagnosis not present

## 2021-07-03 DIAGNOSIS — G44099 Other trigeminal autonomic cephalgias (TAC), not intractable: Secondary | ICD-10-CM | POA: Diagnosis not present

## 2021-07-03 DIAGNOSIS — Z8673 Personal history of transient ischemic attack (TIA), and cerebral infarction without residual deficits: Secondary | ICD-10-CM | POA: Diagnosis not present

## 2021-07-24 ENCOUNTER — Ambulatory Visit: Payer: Medicare Other | Admitting: Cardiology

## 2021-07-27 DIAGNOSIS — Z1231 Encounter for screening mammogram for malignant neoplasm of breast: Secondary | ICD-10-CM | POA: Diagnosis not present

## 2021-08-10 DIAGNOSIS — N6011 Diffuse cystic mastopathy of right breast: Secondary | ICD-10-CM | POA: Diagnosis not present

## 2021-08-10 DIAGNOSIS — N6012 Diffuse cystic mastopathy of left breast: Secondary | ICD-10-CM | POA: Diagnosis not present

## 2021-09-14 DIAGNOSIS — K219 Gastro-esophageal reflux disease without esophagitis: Secondary | ICD-10-CM | POA: Diagnosis not present

## 2021-09-14 DIAGNOSIS — J309 Allergic rhinitis, unspecified: Secondary | ICD-10-CM | POA: Diagnosis not present

## 2021-09-14 DIAGNOSIS — E785 Hyperlipidemia, unspecified: Secondary | ICD-10-CM | POA: Diagnosis not present

## 2021-09-14 DIAGNOSIS — R413 Other amnesia: Secondary | ICD-10-CM | POA: Diagnosis not present

## 2021-09-14 DIAGNOSIS — M19079 Primary osteoarthritis, unspecified ankle and foot: Secondary | ICD-10-CM | POA: Diagnosis not present

## 2021-09-14 DIAGNOSIS — R600 Localized edema: Secondary | ICD-10-CM | POA: Diagnosis not present

## 2021-09-14 DIAGNOSIS — R1033 Periumbilical pain: Secondary | ICD-10-CM | POA: Diagnosis not present

## 2021-09-14 DIAGNOSIS — I10 Essential (primary) hypertension: Secondary | ICD-10-CM | POA: Diagnosis not present

## 2021-09-14 DIAGNOSIS — E559 Vitamin D deficiency, unspecified: Secondary | ICD-10-CM | POA: Diagnosis not present

## 2022-01-18 DIAGNOSIS — I10 Essential (primary) hypertension: Secondary | ICD-10-CM | POA: Diagnosis not present

## 2022-01-18 DIAGNOSIS — R600 Localized edema: Secondary | ICD-10-CM | POA: Diagnosis not present

## 2022-01-18 DIAGNOSIS — E785 Hyperlipidemia, unspecified: Secondary | ICD-10-CM | POA: Diagnosis not present

## 2022-01-18 DIAGNOSIS — K219 Gastro-esophageal reflux disease without esophagitis: Secondary | ICD-10-CM | POA: Diagnosis not present

## 2022-01-18 DIAGNOSIS — R413 Other amnesia: Secondary | ICD-10-CM | POA: Diagnosis not present

## 2022-01-18 DIAGNOSIS — J309 Allergic rhinitis, unspecified: Secondary | ICD-10-CM | POA: Diagnosis not present

## 2022-01-18 DIAGNOSIS — E559 Vitamin D deficiency, unspecified: Secondary | ICD-10-CM | POA: Diagnosis not present

## 2022-01-25 DIAGNOSIS — E559 Vitamin D deficiency, unspecified: Secondary | ICD-10-CM | POA: Diagnosis not present

## 2022-01-25 DIAGNOSIS — Z79899 Other long term (current) drug therapy: Secondary | ICD-10-CM | POA: Diagnosis not present

## 2022-01-25 DIAGNOSIS — E785 Hyperlipidemia, unspecified: Secondary | ICD-10-CM | POA: Diagnosis not present

## 2022-02-01 DIAGNOSIS — D869 Sarcoidosis, unspecified: Secondary | ICD-10-CM | POA: Diagnosis not present

## 2022-02-01 DIAGNOSIS — R2689 Other abnormalities of gait and mobility: Secondary | ICD-10-CM | POA: Diagnosis not present

## 2022-02-01 DIAGNOSIS — M79672 Pain in left foot: Secondary | ICD-10-CM | POA: Diagnosis not present

## 2022-02-05 DIAGNOSIS — Z7184 Encounter for health counseling related to travel: Secondary | ICD-10-CM | POA: Diagnosis not present

## 2022-02-09 DIAGNOSIS — S86312A Strain of muscle(s) and tendon(s) of peroneal muscle group at lower leg level, left leg, initial encounter: Secondary | ICD-10-CM | POA: Diagnosis not present

## 2022-02-22 DIAGNOSIS — S9032XA Contusion of left foot, initial encounter: Secondary | ICD-10-CM | POA: Diagnosis not present

## 2022-03-09 DIAGNOSIS — S9032XD Contusion of left foot, subsequent encounter: Secondary | ICD-10-CM | POA: Diagnosis not present

## 2022-03-30 DIAGNOSIS — S9032XD Contusion of left foot, subsequent encounter: Secondary | ICD-10-CM | POA: Diagnosis not present

## 2022-05-11 DIAGNOSIS — S9032XD Contusion of left foot, subsequent encounter: Secondary | ICD-10-CM | POA: Diagnosis not present

## 2022-05-12 DIAGNOSIS — R600 Localized edema: Secondary | ICD-10-CM | POA: Diagnosis not present

## 2022-05-12 DIAGNOSIS — J309 Allergic rhinitis, unspecified: Secondary | ICD-10-CM | POA: Diagnosis not present

## 2022-05-12 DIAGNOSIS — I10 Essential (primary) hypertension: Secondary | ICD-10-CM | POA: Diagnosis not present

## 2022-05-12 DIAGNOSIS — E785 Hyperlipidemia, unspecified: Secondary | ICD-10-CM | POA: Diagnosis not present

## 2022-05-12 DIAGNOSIS — K219 Gastro-esophageal reflux disease without esophagitis: Secondary | ICD-10-CM | POA: Diagnosis not present

## 2022-05-17 DIAGNOSIS — E785 Hyperlipidemia, unspecified: Secondary | ICD-10-CM | POA: Diagnosis not present

## 2022-05-17 DIAGNOSIS — E559 Vitamin D deficiency, unspecified: Secondary | ICD-10-CM | POA: Diagnosis not present

## 2022-05-17 DIAGNOSIS — Z79899 Other long term (current) drug therapy: Secondary | ICD-10-CM | POA: Diagnosis not present

## 2022-08-02 DIAGNOSIS — Z1231 Encounter for screening mammogram for malignant neoplasm of breast: Secondary | ICD-10-CM | POA: Diagnosis not present

## 2022-08-13 DIAGNOSIS — Z1239 Encounter for other screening for malignant neoplasm of breast: Secondary | ICD-10-CM | POA: Diagnosis not present

## 2022-08-13 DIAGNOSIS — M810 Age-related osteoporosis without current pathological fracture: Secondary | ICD-10-CM | POA: Diagnosis not present

## 2022-08-16 DIAGNOSIS — K219 Gastro-esophageal reflux disease without esophagitis: Secondary | ICD-10-CM | POA: Diagnosis not present

## 2022-08-16 DIAGNOSIS — E785 Hyperlipidemia, unspecified: Secondary | ICD-10-CM | POA: Diagnosis not present

## 2022-08-16 DIAGNOSIS — I10 Essential (primary) hypertension: Secondary | ICD-10-CM | POA: Diagnosis not present

## 2022-08-27 DIAGNOSIS — N6489 Other specified disorders of breast: Secondary | ICD-10-CM | POA: Diagnosis not present

## 2022-08-27 DIAGNOSIS — R928 Other abnormal and inconclusive findings on diagnostic imaging of breast: Secondary | ICD-10-CM | POA: Diagnosis not present

## 2022-09-03 ENCOUNTER — Other Ambulatory Visit: Payer: Self-pay | Admitting: Vascular Surgery

## 2022-09-03 DIAGNOSIS — R928 Other abnormal and inconclusive findings on diagnostic imaging of breast: Secondary | ICD-10-CM

## 2022-09-17 ENCOUNTER — Other Ambulatory Visit: Payer: Self-pay | Admitting: Vascular Surgery

## 2022-09-17 DIAGNOSIS — R928 Other abnormal and inconclusive findings on diagnostic imaging of breast: Secondary | ICD-10-CM

## 2022-10-15 ENCOUNTER — Ambulatory Visit
Admission: RE | Admit: 2022-10-15 | Discharge: 2022-10-15 | Disposition: A | Discharge status: Home / Self Care | Payer: Medicare Other | Source: Ambulatory Visit | Attending: Vascular Surgery | Admitting: Vascular Surgery

## 2022-10-15 DIAGNOSIS — N6031 Fibrosclerosis of right breast: Secondary | ICD-10-CM | POA: Diagnosis not present

## 2022-10-15 DIAGNOSIS — R928 Other abnormal and inconclusive findings on diagnostic imaging of breast: Secondary | ICD-10-CM

## 2022-10-15 HISTORY — PX: BREAST BIOPSY: SHX20

## 2022-10-18 DIAGNOSIS — H01003 Unspecified blepharitis right eye, unspecified eyelid: Secondary | ICD-10-CM | POA: Diagnosis not present

## 2022-10-19 DIAGNOSIS — H524 Presbyopia: Secondary | ICD-10-CM | POA: Diagnosis not present

## 2022-10-25 DIAGNOSIS — N6011 Diffuse cystic mastopathy of right breast: Secondary | ICD-10-CM | POA: Diagnosis not present

## 2022-10-25 DIAGNOSIS — N6012 Diffuse cystic mastopathy of left breast: Secondary | ICD-10-CM | POA: Diagnosis not present

## 2022-10-25 DIAGNOSIS — R928 Other abnormal and inconclusive findings on diagnostic imaging of breast: Secondary | ICD-10-CM | POA: Diagnosis not present

## 2022-12-16 DIAGNOSIS — K219 Gastro-esophageal reflux disease without esophagitis: Secondary | ICD-10-CM | POA: Diagnosis not present

## 2022-12-16 DIAGNOSIS — Z139 Encounter for screening, unspecified: Secondary | ICD-10-CM | POA: Diagnosis not present

## 2022-12-16 DIAGNOSIS — I10 Essential (primary) hypertension: Secondary | ICD-10-CM | POA: Diagnosis not present

## 2022-12-16 DIAGNOSIS — R413 Other amnesia: Secondary | ICD-10-CM | POA: Diagnosis not present

## 2022-12-16 DIAGNOSIS — R600 Localized edema: Secondary | ICD-10-CM | POA: Diagnosis not present

## 2022-12-16 DIAGNOSIS — Z9181 History of falling: Secondary | ICD-10-CM | POA: Diagnosis not present

## 2022-12-16 DIAGNOSIS — E785 Hyperlipidemia, unspecified: Secondary | ICD-10-CM | POA: Diagnosis not present

## 2022-12-16 DIAGNOSIS — J309 Allergic rhinitis, unspecified: Secondary | ICD-10-CM | POA: Diagnosis not present

## 2022-12-22 ENCOUNTER — Other Ambulatory Visit: Payer: Self-pay | Admitting: Vascular Surgery

## 2022-12-22 DIAGNOSIS — N6489 Other specified disorders of breast: Secondary | ICD-10-CM

## 2023-01-06 NOTE — Progress Notes (Addendum)
Cardiology Office Note:  .   Date:  01/07/2023  ID:  Desiree Montgomery, DOB 1956/06/18, MRN [id number] PCP: Lucianne Lei, MD  [facility] Providers Cardiologist:  None    History of Present Illness: .   Desiree Montgomery is a 67 y.o. female with a past medical history of sarcoidosis, hypertension, CVA, near syncopal event, carotid artery stenosis, mild atherosclerotic calcification of the abdominal aorta noted on CT imaging in 2020.  06/10/17 cMRI No delayed gadolinium uptake on delayed inversion recovery sequences 05/31/2017 monitor sinus rhythm with no arrhythmias 05/17/2017 echo EF 55 to 65% mild MR trace TR 05/17/2017 carotid ultrasound mild bilateral carotid artery stenosis  Most recently evaluated in December 2021 by Dr. Bing Matter, she was doing well at this time and advised to follow-up in a year.  She presents today for follow up, has been doing very well and offers no formal complaints. She walks 2 miles/day, has lost weight ~ 40 lbs through diet and lifestyle changes. She denies chest pain, palpitations, dyspnea, pnd, orthopnea, n, v, dizziness, syncope, edema, weight gain, or early satiety.   ROS: Review of Systems  All other systems reviewed and are negative.    Studies Reviewed: .        Cardiac Studies & Procedures       ECHOCARDIOGRAM  ECHOCARDIOGRAM COMPLETE 05/17/2017  Narrative *[facility]* [address] [place], [place] [address] [phone]  ------------------------------------------------------------------- Transthoracic Echocardiography  Patient:    Desiree Montgomery MR #:       [id number] Study Date: 05/17/2017 Gender:     F Age:        54 Height:     167.6 cm Weight:     88.8 kg BSA:        2.06 m^2 Pt. Status: Room:  ATTENDING    Gypsy Balsam, MD ORDERING     Gypsy Balsam, MD REFERRING    Gypsy Balsam, MD PERFORMING   [facility] [place] SONOGRAPHER  Jimmy Reel,  RDCS  cc:  ------------------------------------------------------------------- LV EF: 55% -   65%  ------------------------------------------------------------------- Indications:      CVA 436.  ------------------------------------------------------------------- History:   PMH:   Chest pain.  Risk factors:  Hypertension. Dyslipidemia.  ------------------------------------------------------------------- Study Conclusions  - Left ventricle: The cavity size was normal. Systolic function was normal. The estimated ejection fraction was in the range of 55% to 65%. Wall motion was normal; there were no regional wall motion abnormalities. Left ventricular diastolic function parameters were normal. - Mitral valve: There was mild regurgitation.  Impressions:  - Normal LVEF. MIld MR, trace TR.  ------------------------------------------------------------------- Study data:  No prior study was available for comparison.  Study status:  Routine.  Procedure:  The patient reported no pain pre or post test. Transthoracic echocardiography. Image quality was adequate.  Study completion:  There were no complications. Transthoracic echocardiography.  M-mode, complete 2D, spectral Doppler, and color Doppler.  Birthdate:  Patient birthdate: [birth year].  Age:  Patient is 66 yr old.  Sex:  Gender: female. BMI: 31.6 kg/m^2.  Blood pressure:     138/74  Patient status: Outpatient.  Study date:  Study date: 05/17/2017. Study time: 11:15 AM.  Location:  Echo laboratory.  -------------------------------------------------------------------  ------------------------------------------------------------------- Left ventricle:  The cavity size was normal. Systolic function was normal. The estimated ejection fraction was in the range of 55% to 65%. Wall motion was normal; there were no regional wall motion abnormalities. The transmitral flow pattern was normal. The deceleration  time of the early  transmitral flow velocity was normal. The pulmonary vein flow pattern was normal. The tissue Doppler parameters were normal. Left ventricular diastolic function parameters were normal.  ------------------------------------------------------------------- Aortic valve:   Trileaflet; normal thickness leaflets. Mobility was not restricted.  Doppler:  Transvalvular velocity was within the normal range. There was no stenosis. There was no regurgitation. VTI ratio of LVOT to aortic valve: 0.69. Peak velocity ratio of LVOT to aortic valve: 0.78. Mean velocity ratio of LVOT to aortic valve: 0.76.    Mean gradient (S): 5 mm Hg. Peak gradient (S): 8 mm Hg.  ------------------------------------------------------------------- Aorta:  Aortic root: The aortic root was normal in size.  ------------------------------------------------------------------- Mitral valve:   Structurally normal valve.   Mobility was not restricted.  Doppler:  Transvalvular velocity was within the normal range. There was no evidence for stenosis. There was mild regurgitation.    Peak gradient (D): 4 mm Hg.  ------------------------------------------------------------------- Left atrium:  The atrium was at the upper limits of normal in size.  ------------------------------------------------------------------- Right ventricle:  The cavity size was normal. Wall thickness was normal. Systolic function was normal.  ------------------------------------------------------------------- Pulmonic valve:    Structurally normal valve.   Cusp separation was normal.  Doppler:  Transvalvular velocity was within the normal range. There was no evidence for stenosis. There was mild regurgitation.  ------------------------------------------------------------------- Tricuspid valve:   Structurally normal valve.    Doppler: Transvalvular velocity was within the normal range. There was trivial  regurgitation.  ------------------------------------------------------------------- Pulmonary artery:   The main pulmonary artery was normal-sized. Systolic pressure was within the normal range.  ------------------------------------------------------------------- Right atrium:  The atrium was normal in size.  ------------------------------------------------------------------- Pericardium:  There was no pericardial effusion.  ------------------------------------------------------------------- Systemic veins: Inferior vena cava: The vessel was normal in size.  ------------------------------------------------------------------- Measurements  Left ventricle                        Value        Reference LV ID, ED, PLAX chordal               43.4  mm     43 - 52 LV ID, ES, PLAX chordal               29.8  mm     23 - 38 LV fx shortening, PLAX chordal        31    %      >=29 LV PW thickness, ED                   11.4  mm     ---------- IVS/LV PW ratio, ED                   0.87         <=1.3 LV e&', lateral                        6.85  cm/s   ---------- LV E/e&', lateral                      14.29        ---------- LV e&', medial                         5.77  cm/s   ---------- LV E/e&', medial  16.97        ---------- LV e&', average                        6.31  cm/s   ---------- LV E/e&', average                      15.52        ---------- Longitudinal strain, TDI              19    %      ----------  Ventricular septum                    Value        Reference IVS thickness, ED                     9.91  mm     ----------  LVOT                                  Value        Reference LVOT peak velocity, S                 113   cm/s   ---------- LVOT mean velocity, S                 79.7  cm/s   ---------- LVOT VTI, S                           24.3  cm     ---------- LVOT peak gradient, S                 5     mm Hg  ----------  Aortic valve                           Value        Reference Aortic valve peak velocity, S         145   cm/s   ---------- Aortic valve mean velocity, S         105   cm/s   ---------- Aortic valve VTI, S                   35    cm     ---------- Aortic mean gradient, S               5     mm Hg  ---------- Aortic peak gradient, S               8     mm Hg  ---------- VTI ratio, LVOT/AV                    0.69         ---------- Velocity ratio, peak, LVOT/AV         0.78         ---------- Velocity ratio, mean, LVOT/AV         0.76         ----------  Aorta  Value        Reference Aortic root ID, ED                    30    mm     ---------- Ascending aorta ID, A-P, S            34    mm     ----------  Left atrium                           Value        Reference LA ID, A-P, ES                        41    mm     ---------- LA ID/bsa, A-P                        1.99  cm/m^2 <=2.2 LA volume, S                          44    ml     ---------- LA volume/bsa, S                      21.3  ml/m^2 ---------- LA volume, ES, 1-p A4C                35.1  ml     ---------- LA volume/bsa, ES, 1-p A4C            17    ml/m^2 ---------- LA volume, ES, 1-p A2C                54.7  ml     ---------- LA volume/bsa, ES, 1-p A2C            26.5  ml/m^2 ----------  Mitral valve                          Value        Reference Mitral E-wave peak velocity           97.9  cm/s   ---------- Mitral A-wave peak velocity           103   cm/s   ---------- Mitral deceleration time       (H)    232   ms     150 - 230 Mitral peak gradient, D               4     mm Hg  ---------- Mitral E/A ratio, peak                1            ----------  Pulmonary arteries                    Value        Reference PA pressure, S, DP                    24    mm Hg  <=30  Tricuspid valve                       Value        Reference Tricuspid regurg peak velocity  228   cm/s   ---------- Tricuspid peak RV-RA gradient          21    mm Hg  ----------  Right atrium                          Value        Reference RA ID, S-I, ES, A4C                   36.2  mm     34 - 49 RA area, ES, A4C                      9.4   cm^2   8.3 - 19.5 RA volume, ES, A/L                    20.5  ml     ---------- RA volume/bsa, ES, A/L                9.9   ml/m^2 ----------  Systemic veins                        Value        Reference Estimated CVP                         3     mm Hg  ----------  Right ventricle                       Value        Reference TAPSE                                 23    mm     ---------- RV pressure, S, DP                    24    mm Hg  <=30 RV s&', lateral, S                     10.4  cm/s   ----------  Legend: (L)  and  (H)  mark values outside specified reference range.  ------------------------------------------------------------------- Prepared and Electronically Authenticated by  Gypsy Balsam, MD 2018-11-20T14:19:11    [facility]  CARDIAC EVENT MONITOR 05/31/2017  Narrative MARQUERITE FORSMAN, DOB 1956/02/12, MRN [id number]  EVENT MONITOR REPORT:   Patient was monitored from 05/31/2017 to 06/29/2017. Indication:                    Palpitations Ordering physician:  Georgeanna Lea, MD Referring physician:  Georgeanna Lea MD   Baseline rhythm: Sinus  Atrial arrhythmia: None  Ventricular arrhythmia: Rare  Conduction abnormality: None  Symptoms: Multiple events analyzed showing normal sinus rhythm with no arrhythmia.  Event for weakness fatigue and chest pain showed normal sinus rhythm   Conclusion: Multiple trigger events showing normal sinus rhythm  Interpreting  cardiologist: Gypsy Balsam, MD Date: 07/11/2017 4:02 PM    CARDIAC MRI  MR CARDIAC MORPHOLOGY W WO CONTRAST 06/10/2017  Narrative CLINICAL DATA:  Sarcoid  EXAM: CARDIAC MRI  TECHNIQUE: The patient was scanned on a 1.5 Tesla GE magnet. A dedicated cardiac coil was used. Functional imaging was  done using Fiesta sequences. 2,3, and  4 chamber views were done to assess for RWMA's. Modified Simpson's rule using a short axis stack was used to calculate an ejection fraction on a dedicated work [employer]. The patient received 30 cc of Multihance. After 10 minutes inversion recovery sequences were used to assess for infiltration and scar tissue.  CONTRAST:  30 cc Multihance  FINDINGS: There was mild LAE. The RA/RV were normal in size and function There was no ASD/PFO or VSD. There was no pericardial effusion. The aortic root was normal. 3.0 cm. The AV was tri leaflet and normal  There was trivial MR. There was mild LAE with no LAA thrombus. The LV was normal in size and function with no discrete RWMA;s The quantitative EF was 65% (EDV 101 cc ESV 35 cc SV 66 cc) Delayed enhancement images with gadolinium showed no uptake in the LV myocardium  IMPRESSION: 1) Normal LV size and function EF 65%  2) No delayed gadolinium uptake on delayed inversion recovery sequences  3) Mild LAE  4) Trivial MR  5) Normal aortic root 3.0 cm and normal tri leaflet AV  6) Normal RV size and function  7) No pericardial effusion  Charlton Haws   Electronically Signed By: Charlton Haws M.D. On: 06/10/2017 10:45       EKG Interpretation Date/Time:  Friday January 07 2023 10:33:16 EDT Ventricular Rate:  77 PR Interval:  132 QRS Duration:  100 QT Interval:  412 QTC Calculation: 466 R Axis:   102  Text Interpretation: Normal sinus rhythm Rightward axis Borderline ECG When compared with ECG of 04-Apr-2008 08:03, No significant change was found Confirmed by Wallis Bamberg [phone]) on 01/07/2023 11:33:34 AM    Risk Assessment/Calculations:             Physical Exam:   VS:  BP 110/60 (BP Location: Right Arm, Patient Position: Sitting, Cuff Size: Normal)   Pulse 77   Ht 5\' 5"  (1.651 m)   Wt 169 lb 9.6 oz (76.9 kg)   SpO2 100%   BMI 28.22 kg/m    Wt Readings from Last 3  Encounters:  01/07/23 169 lb 9.6 oz (76.9 kg)  06/09/20 167 lb (75.8 kg)  07/16/19 173 lb (78.5 kg)    GEN: Well nourished, well developed in no acute distress NECK: No JVD; No carotid bruits CARDIAC: RRR, 2/6 murmur LSB,  no rubs, no gallops RESPIRATORY:  Clear to auscultation without rales, wheezing or rhonchi  ABDOMEN: Soft, non-tender, non-distended EXTREMITIES:  No edema; No deformity   ASSESSMENT AND PLAN: .   Abdominal aortic atherosclerosis -noted on CT imaging in 2020, continue Lipitor 40 mg daily, continue Plavix 75 mg daily (on for CVA prevention). Stable with no anginal symptoms. No indication for ischemic evaluation.   Hypertension - BP today is well controlled at 110/60, continue Cardizem 180 mg Murmur - MR on last echo in 2018, 2/6 murmur heard, denies SOB or exercise intolerance, discussed repeating echo if she would like, we decided to just monitor for now.  History of CVA - on Plavix, no residual deficits Hyperlipidemia - managed by her PCP, continue Lipitor 40 mg daily Pulmonary Sarcoidosis - follows with pulmonologist in [facility]     Dispo: Return in 1 year  Signed, Flossie Dibble, NP

## 2023-01-07 ENCOUNTER — Encounter: Payer: Self-pay | Admitting: Cardiology

## 2023-01-07 ENCOUNTER — Ambulatory Visit: Payer: Medicare Other | Attending: Cardiology | Admitting: Cardiology

## 2023-01-07 VITALS — BP 110/60 | HR 77 | Ht 65.0 in | Wt 169.6 lb

## 2023-01-07 DIAGNOSIS — R011 Cardiac murmur, unspecified: Secondary | ICD-10-CM

## 2023-01-07 DIAGNOSIS — I10 Essential (primary) hypertension: Secondary | ICD-10-CM

## 2023-01-07 DIAGNOSIS — I639 Cerebral infarction, unspecified: Secondary | ICD-10-CM

## 2023-01-07 DIAGNOSIS — I700 Atherosclerosis of aorta: Secondary | ICD-10-CM | POA: Diagnosis not present

## 2023-01-07 DIAGNOSIS — D869 Sarcoidosis, unspecified: Secondary | ICD-10-CM

## 2023-01-07 NOTE — Patient Instructions (Signed)
Medication Instructions:  Your physician recommends that you continue on your current medications as directed. Please refer to the Current Medication list given to you today.  *If you need a refill on your cardiac medications before your next appointment, please call your pharmacy*   Lab Work: None Ordered If you have labs (blood work) drawn today and your tests are completely normal, you will receive your results only by: MyChart Message (if you have MyChart) OR A paper copy in the mail If you have any lab test that is abnormal or we need to change your treatment, we will call you to review the results.   Testing/Procedures: None Ordered   Follow-Up: At [facility], you and your health needs are our priority.  As part of our continuing mission to provide you with exceptional heart care, we have created designated Provider Care Teams.  These Care Teams include your primary Cardiologist (physician) and Advanced Practice Providers (APPs -  Physician Assistants and Nurse Practitioners) who all work together to provide you with the care you need, when you need it.  We recommend signing up for the patient portal called "MyChart".  Sign up information is provided on this After Visit Summary.  MyChart is used to connect with patients for Virtual Visits (Telemedicine).  Patients are able to view lab/test results, encounter notes, upcoming appointments, etc.  Non-urgent messages can be sent to your provider as well.   To learn more about what you can do with MyChart, go to [email or web address].    Your next appointment:   12 month(s)  The format for your next appointment:   In Person  Provider:   Wallis Bamberg, NP   Other Instructions NA

## 2023-01-19 DIAGNOSIS — Z9181 History of falling: Secondary | ICD-10-CM | POA: Diagnosis not present

## 2023-01-19 DIAGNOSIS — Z0000 Encounter for general adult medical examination without abnormal findings: Secondary | ICD-10-CM | POA: Diagnosis not present

## 2023-01-21 DIAGNOSIS — M85852 Other specified disorders of bone density and structure, left thigh: Secondary | ICD-10-CM | POA: Diagnosis not present

## 2023-01-21 DIAGNOSIS — M810 Age-related osteoporosis without current pathological fracture: Secondary | ICD-10-CM | POA: Diagnosis not present

## 2023-01-25 DIAGNOSIS — I10 Essential (primary) hypertension: Secondary | ICD-10-CM | POA: Diagnosis not present

## 2023-01-25 DIAGNOSIS — H81392 Other peripheral vertigo, left ear: Secondary | ICD-10-CM | POA: Diagnosis not present

## 2023-01-25 DIAGNOSIS — R42 Dizziness and giddiness: Secondary | ICD-10-CM | POA: Diagnosis not present

## 2023-01-25 DIAGNOSIS — I6782 Cerebral ischemia: Secondary | ICD-10-CM | POA: Diagnosis not present

## 2023-01-25 DIAGNOSIS — I639 Cerebral infarction, unspecified: Secondary | ICD-10-CM | POA: Diagnosis not present

## 2023-01-25 DIAGNOSIS — Z79899 Other long term (current) drug therapy: Secondary | ICD-10-CM | POA: Diagnosis not present

## 2023-01-25 DIAGNOSIS — G9389 Other specified disorders of brain: Secondary | ICD-10-CM | POA: Diagnosis not present

## 2023-01-25 DIAGNOSIS — Z7902 Long term (current) use of antithrombotics/antiplatelets: Secondary | ICD-10-CM | POA: Diagnosis not present

## 2023-01-25 DIAGNOSIS — R9431 Abnormal electrocardiogram [ECG] [EKG]: Secondary | ICD-10-CM | POA: Diagnosis not present

## 2023-01-31 ENCOUNTER — Telehealth: Payer: Self-pay

## 2023-01-31 NOTE — Telephone Encounter (Signed)
Transition Care Management Unsuccessful Follow-up Telephone Call  Date of discharge and from where:  01/25/2023 [facility]  Attempts:  1st Attempt  Reason for unsuccessful TCM follow-up call:  No answer/busy   [facility]  [facility] Population Health Community Resource Care Guide   [email or web address]  ?? [phone]   Website: [email or web address]  [email or web address]

## 2023-02-01 ENCOUNTER — Telehealth: Payer: Self-pay

## 2023-02-01 NOTE — Telephone Encounter (Signed)
Transition Care Management Unsuccessful Follow-up Telephone Call  Date of discharge and from where:  01/25/2023 [facility]  Attempts:  2nd Attempt  Reason for unsuccessful TCM follow-up call:  Left voice message   [facility]  [facility] Population Health Community Resource Care Guide   [email or web address]  ?? [phone]   Website: [email or web address]  [email or web address]

## 2023-03-31 DIAGNOSIS — R42 Dizziness and giddiness: Secondary | ICD-10-CM | POA: Diagnosis not present

## 2023-03-31 DIAGNOSIS — Z09 Encounter for follow-up examination after completed treatment for conditions other than malignant neoplasm: Secondary | ICD-10-CM | POA: Diagnosis not present

## 2023-03-31 DIAGNOSIS — E559 Vitamin D deficiency, unspecified: Secondary | ICD-10-CM | POA: Diagnosis not present

## 2023-04-22 ENCOUNTER — Ambulatory Visit: Payer: Medicare Other

## 2023-04-22 ENCOUNTER — Ambulatory Visit
Admission: RE | Admit: 2023-04-22 | Discharge: 2023-04-22 | Disposition: A | Discharge status: Home / Self Care | Payer: Medicare Other | Source: Ambulatory Visit | Attending: Vascular Surgery | Admitting: Vascular Surgery

## 2023-04-22 DIAGNOSIS — N6489 Other specified disorders of breast: Secondary | ICD-10-CM

## 2023-04-22 DIAGNOSIS — R928 Other abnormal and inconclusive findings on diagnostic imaging of breast: Secondary | ICD-10-CM | POA: Diagnosis not present

## 2023-04-25 DIAGNOSIS — G44099 Other trigeminal autonomic cephalgias (TAC), not intractable: Secondary | ICD-10-CM | POA: Diagnosis not present

## 2023-04-25 DIAGNOSIS — Z8673 Personal history of transient ischemic attack (TIA), and cerebral infarction without residual deficits: Secondary | ICD-10-CM | POA: Diagnosis not present

## 2023-05-02 DIAGNOSIS — R928 Other abnormal and inconclusive findings on diagnostic imaging of breast: Secondary | ICD-10-CM | POA: Diagnosis not present

## 2023-05-02 DIAGNOSIS — N6011 Diffuse cystic mastopathy of right breast: Secondary | ICD-10-CM | POA: Diagnosis not present

## 2023-05-02 DIAGNOSIS — N6012 Diffuse cystic mastopathy of left breast: Secondary | ICD-10-CM | POA: Diagnosis not present

## 2023-05-09 DIAGNOSIS — R42 Dizziness and giddiness: Secondary | ICD-10-CM | POA: Diagnosis not present

## 2023-05-09 DIAGNOSIS — E785 Hyperlipidemia, unspecified: Secondary | ICD-10-CM | POA: Diagnosis not present

## 2023-05-09 DIAGNOSIS — J309 Allergic rhinitis, unspecified: Secondary | ICD-10-CM | POA: Diagnosis not present

## 2023-05-09 DIAGNOSIS — R600 Localized edema: Secondary | ICD-10-CM | POA: Diagnosis not present

## 2023-05-09 DIAGNOSIS — R413 Other amnesia: Secondary | ICD-10-CM | POA: Diagnosis not present

## 2023-05-09 DIAGNOSIS — K219 Gastro-esophageal reflux disease without esophagitis: Secondary | ICD-10-CM | POA: Diagnosis not present

## 2023-08-07 DIAGNOSIS — D860 Sarcoidosis of lung: Secondary | ICD-10-CM | POA: Diagnosis not present

## 2023-08-07 DIAGNOSIS — R0981 Nasal congestion: Secondary | ICD-10-CM | POA: Diagnosis not present

## 2023-08-07 DIAGNOSIS — R051 Acute cough: Secondary | ICD-10-CM | POA: Diagnosis not present

## 2023-08-07 DIAGNOSIS — R0602 Shortness of breath: Secondary | ICD-10-CM | POA: Diagnosis not present

## 2023-08-11 DIAGNOSIS — Z79899 Other long term (current) drug therapy: Secondary | ICD-10-CM | POA: Diagnosis not present

## 2023-08-11 DIAGNOSIS — Z862 Personal history of diseases of the blood and blood-forming organs and certain disorders involving the immune mechanism: Secondary | ICD-10-CM | POA: Diagnosis not present

## 2023-08-11 DIAGNOSIS — E559 Vitamin D deficiency, unspecified: Secondary | ICD-10-CM | POA: Diagnosis not present

## 2023-08-11 DIAGNOSIS — J019 Acute sinusitis, unspecified: Secondary | ICD-10-CM | POA: Diagnosis not present

## 2023-08-24 DIAGNOSIS — E785 Hyperlipidemia, unspecified: Secondary | ICD-10-CM | POA: Diagnosis not present

## 2023-08-24 DIAGNOSIS — R42 Dizziness and giddiness: Secondary | ICD-10-CM | POA: Diagnosis not present

## 2023-08-24 DIAGNOSIS — I10 Essential (primary) hypertension: Secondary | ICD-10-CM | POA: Diagnosis not present

## 2023-08-24 DIAGNOSIS — Z862 Personal history of diseases of the blood and blood-forming organs and certain disorders involving the immune mechanism: Secondary | ICD-10-CM | POA: Diagnosis not present

## 2023-08-24 DIAGNOSIS — J309 Allergic rhinitis, unspecified: Secondary | ICD-10-CM | POA: Diagnosis not present

## 2023-08-24 DIAGNOSIS — R600 Localized edema: Secondary | ICD-10-CM | POA: Diagnosis not present

## 2023-08-24 DIAGNOSIS — K219 Gastro-esophageal reflux disease without esophagitis: Secondary | ICD-10-CM | POA: Diagnosis not present

## 2023-08-26 ENCOUNTER — Other Ambulatory Visit: Payer: Self-pay | Admitting: Vascular Surgery

## 2023-08-26 DIAGNOSIS — Z0000 Encounter for general adult medical examination without abnormal findings: Secondary | ICD-10-CM

## 2023-09-23 ENCOUNTER — Encounter

## 2023-09-30 ENCOUNTER — Encounter

## 2023-10-14 ENCOUNTER — Ambulatory Visit

## 2023-10-21 ENCOUNTER — Encounter (HOSPITAL_BASED_OUTPATIENT_CLINIC_OR_DEPARTMENT_OTHER): Payer: Self-pay

## 2023-11-04 ENCOUNTER — Encounter (HOSPITAL_COMMUNITY): Payer: Self-pay

## 2023-11-04 ENCOUNTER — Ambulatory Visit

## 2023-11-11 ENCOUNTER — Ambulatory Visit

## 2023-11-11 ENCOUNTER — Ambulatory Visit
Admission: RE | Admit: 2023-11-11 | Discharge: 2023-11-11 | Disposition: A | Discharge status: Home / Self Care | Source: Ambulatory Visit | Attending: Vascular Surgery | Admitting: Vascular Surgery

## 2023-11-11 DIAGNOSIS — Z0000 Encounter for general adult medical examination without abnormal findings: Secondary | ICD-10-CM

## 2023-11-11 DIAGNOSIS — Z1231 Encounter for screening mammogram for malignant neoplasm of breast: Secondary | ICD-10-CM | POA: Diagnosis not present

## 2023-11-17 ENCOUNTER — Ambulatory Visit: Payer: Self-pay | Admitting: Physician Assistant

## 2023-11-17 ENCOUNTER — Other Ambulatory Visit: Payer: Self-pay | Admitting: Vascular Surgery

## 2023-11-17 DIAGNOSIS — R928 Other abnormal and inconclusive findings on diagnostic imaging of breast: Secondary | ICD-10-CM

## 2023-12-06 ENCOUNTER — Ambulatory Visit

## 2023-12-06 ENCOUNTER — Ambulatory Visit
Admission: RE | Admit: 2023-12-06 | Discharge: 2023-12-06 | Disposition: A | Discharge status: Home / Self Care | Source: Ambulatory Visit | Attending: Vascular Surgery | Admitting: Vascular Surgery

## 2023-12-06 DIAGNOSIS — R928 Other abnormal and inconclusive findings on diagnostic imaging of breast: Secondary | ICD-10-CM

## 2023-12-09 ENCOUNTER — Other Ambulatory Visit

## 2023-12-09 ENCOUNTER — Encounter

## 2023-12-19 DIAGNOSIS — N6012 Diffuse cystic mastopathy of left breast: Secondary | ICD-10-CM | POA: Diagnosis not present

## 2023-12-19 DIAGNOSIS — N6011 Diffuse cystic mastopathy of right breast: Secondary | ICD-10-CM | POA: Diagnosis not present

## 2024-01-12 DIAGNOSIS — E559 Vitamin D deficiency, unspecified: Secondary | ICD-10-CM | POA: Diagnosis not present

## 2024-01-12 DIAGNOSIS — Z862 Personal history of diseases of the blood and blood-forming organs and certain disorders involving the immune mechanism: Secondary | ICD-10-CM | POA: Diagnosis not present

## 2024-01-12 DIAGNOSIS — R413 Other amnesia: Secondary | ICD-10-CM | POA: Diagnosis not present

## 2024-01-12 DIAGNOSIS — J309 Allergic rhinitis, unspecified: Secondary | ICD-10-CM | POA: Diagnosis not present

## 2024-01-12 DIAGNOSIS — I10 Essential (primary) hypertension: Secondary | ICD-10-CM | POA: Diagnosis not present

## 2024-01-12 DIAGNOSIS — R739 Hyperglycemia, unspecified: Secondary | ICD-10-CM | POA: Diagnosis not present

## 2024-01-12 DIAGNOSIS — E785 Hyperlipidemia, unspecified: Secondary | ICD-10-CM | POA: Diagnosis not present

## 2024-01-12 DIAGNOSIS — K219 Gastro-esophageal reflux disease without esophagitis: Secondary | ICD-10-CM | POA: Diagnosis not present

## 2024-01-12 DIAGNOSIS — R42 Dizziness and giddiness: Secondary | ICD-10-CM | POA: Diagnosis not present

## 2024-01-12 DIAGNOSIS — R600 Localized edema: Secondary | ICD-10-CM | POA: Diagnosis not present

## 2024-01-12 DIAGNOSIS — Z79899 Other long term (current) drug therapy: Secondary | ICD-10-CM | POA: Diagnosis not present

## 2024-01-31 DIAGNOSIS — Z6823 Body mass index (BMI) 23.0-23.9, adult: Secondary | ICD-10-CM | POA: Diagnosis not present

## 2024-01-31 DIAGNOSIS — K529 Noninfective gastroenteritis and colitis, unspecified: Secondary | ICD-10-CM | POA: Diagnosis not present

## 2024-01-31 DIAGNOSIS — R413 Other amnesia: Secondary | ICD-10-CM | POA: Diagnosis not present

## 2024-02-06 DIAGNOSIS — K529 Noninfective gastroenteritis and colitis, unspecified: Secondary | ICD-10-CM | POA: Diagnosis not present

## 2024-02-21 ENCOUNTER — Encounter: Payer: Self-pay | Admitting: Physician Assistant

## 2024-02-29 DIAGNOSIS — K529 Noninfective gastroenteritis and colitis, unspecified: Secondary | ICD-10-CM | POA: Diagnosis not present

## 2024-02-29 DIAGNOSIS — Z6823 Body mass index (BMI) 23.0-23.9, adult: Secondary | ICD-10-CM | POA: Diagnosis not present

## 2024-02-29 DIAGNOSIS — R634 Abnormal weight loss: Secondary | ICD-10-CM | POA: Diagnosis not present

## 2024-02-29 DIAGNOSIS — R413 Other amnesia: Secondary | ICD-10-CM | POA: Diagnosis not present

## 2024-04-03 DIAGNOSIS — Z0000 Encounter for general adult medical examination without abnormal findings: Secondary | ICD-10-CM | POA: Diagnosis not present

## 2024-04-03 DIAGNOSIS — Z9181 History of falling: Secondary | ICD-10-CM | POA: Diagnosis not present

## 2024-04-11 NOTE — Progress Notes (Unsigned)
 04/12/2024 Desiree Montgomery [id number] 1955-09-11  Referring provider: Gable Cambric, MD [facility] health internal medicine) Primary GI doctor: Dr. Charlanne  ASSESSMENT AND PLAN:  Chronic diarrhea x July 30th associated frequency, urgency has slowed down in Sept and then got worse, fecal incontinence, nocturnal symptoms, watery stools Actively trying to lose weight, denies fever, chills 02/06/2024 negative lacticferritin C. difficile Giardia cryptosporidium ova and parasite -KUB to evaluate for stool burden/obstruction  -CRP/ESR, consider fecal calprotectin if elevated - TSH  -TTG/IGA to evaluate for celiac disease.  -Can do trial of IBGARD daily,  Bentyl as needed -Add on citracel/benefiber, FODMAP, trial off lactulose and lifestyle changes discussed -Imodium as needed for now, can take prior to eating or travel -Will schedule for endoscopic evaluation to rule out microscopic colitis/malignancy with history, discussed with patient and agrees with plan. Risk of bowel prep, conscious sedation, colonoscopy were discussed.  Treatment plan was discussed with patient, and agreed upon.  -Consider SIBO testing or xifaxin trial if negative versus colestipol trial  Family history of colon cancer (colon cancer in mother age 69) Previous normal colonoscopy 2016 States had recall in 10 years Unable to see report  History of CVA On Plavix from Dr. Gable Hold Plavix for 5 days before procedure will instruct when and how to resume after procedure.  Patient understands that there is a low but real risk of cardiovascular event such as heart attack, stroke, or embolism /  thrombosis, or ischemia while off Plavix.  The patient consents to proceed.  Will communicate by phone or EMR with patient's prescribing provider to confirm that holding Plavix is reasonable in this case.   GERD On lansoprazole and famotidine Controlled at this time  History of sarcoidosis Follows with Dr. Jude Not on oxygen  is not followed up a long time  Patient Care Team: Gable Cambric, MD as PCP - General (Internal Medicine) Brien Belvie BRAVO, MD (Pulmonary Disease)  HISTORY OF PRESENT ILLNESS: 68 y.o. female with a past medical history listed below presents for evaluation of Diarrhea.   Discussed the use of AI scribe software for clinical note transcription with the patient, who gave verbal consent to proceed.  History of Present Illness   Desiree Montgomery is a 68 year old female who presents with chronic diarrhea.  She has been experiencing diarrhea since September, coinciding with her birthday. The onset was sudden, with episodes of fecal incontinence due to an inability to reach the bathroom in time. Initially, the diarrhea occurred daily or every other day and persisted until November, with a decrease in frequency starting in October. The episodes are large in volume and often urgent, sometimes requiring her to stop during car rides to relieve herself. She also experiences nocturnal symptoms, waking up at night to use the bathroom.  No fever, chills, or blood in the stool. She has been actively trying to lose weight and has reached 141 pounds, which she feels is a good weight. However, the diarrhea contributed to a more rapid weight loss than intended. No dark or black stools, abdominal pain, or significant heartburn, though she has experienced heartburn once or twice.  Her past medical history includes a cholecystectomy approximately 20 years ago. She is currently on Plavix and nortelazine. No shortness of breath or chest pain, although she occasionally feels something related to a past stroke.  Family history is significant for colon cancer in her mother.      Wt Readings from Last 2 Encounters:  04/12/24  144 lb 8 oz (65.5 kg)  01/07/23 169 lb 9.6 oz (76.9 kg)     She  reports that she has never smoked. She has never used smokeless tobacco. She reports current alcohol use. She reports that she  does not use drugs.  RELEVANT GI HISTORY, IMAGING AND LABS: Results          CBC    Component Value Date/Time   WBC 3.6 (L) 04/04/2008 0914   RBC 4.76 04/04/2008 0914   HGB 14.0 04/04/2008 0914   HCT 41.5 04/04/2008 0914   PLT 270 04/04/2008 0914   MCV 87.3 04/04/2008 0914   MCHC 33.6 04/04/2008 0914   RDW 12.9 04/04/2008 0914   No results for input(s): HGB in the last 8760 hours.  CMP     Component Value Date/Time   NA 141 05/25/2017 1012   K 4.4 05/25/2017 1012   CL 100 05/25/2017 1012   CO2 26 05/25/2017 1012   GLUCOSE 93 05/25/2017 1012   GLUCOSE 79 04/04/2008 0914   BUN 9 05/25/2017 1012   CREATININE 0.84 05/25/2017 1012   CALCIUM 9.9 05/25/2017 1012   PROT 7.4 05/25/2017 1012   ALBUMIN 4.4 05/25/2017 1012   AST 19 05/25/2017 1012   ALT 15 05/25/2017 1012   ALKPHOS 103 05/25/2017 1012   BILITOT 0.4 05/25/2017 1012   GFRNONAA 75 05/25/2017 1012   GFRAA 86 05/25/2017 1012      Latest Ref Rng & Units 05/25/2017   10:12 AM 04/04/2008    9:14 AM  Hepatic Function  Total Protein 6.0 - 8.5 g/dL 7.4  7.4   Albumin 3.6 - 4.8 g/dL 4.4  3.7   AST 0 - 40 IU/L 19  28   ALT 0 - 32 IU/L 15  25   Alk Phosphatase 39 - 117 IU/L 103  81   Total Bilirubin 0.0 - 1.2 mg/dL 0.4  0.4       Current Medications:    Current Outpatient Medications (Cardiovascular):    atorvastatin (LIPITOR) 40 MG tablet, Take 40 mg by mouth daily.   furosemide (LASIX) 20 MG tablet, Take 20 mg by mouth daily.    diltiazem (CARDIZEM CD) 180 MG 24 hr capsule, Take 180 mg by mouth daily. (Patient not taking: Reported on 04/12/2024)  Current Outpatient Medications (Respiratory):    loratadine (CLARITIN) 10 MG tablet, Take 10 mg by mouth daily.   montelukast  (SINGULAIR ) 10 MG tablet, Take 1 tablet (10 mg total) by mouth at bedtime.   promethazine-dextromethorphan (PROMETHAZINE-DM) 6.25-15 MG/5ML syrup, Take 6.25 mg by mouth as needed.   Current Outpatient Medications (Hematological):     clopidogrel (PLAVIX) 75 MG tablet, Take 75 mg by mouth daily.  Current Outpatient Medications (Other):    BIOTIN PO, Take by mouth daily.   calcium-vitamin D (OSCAL WITH D) 500-200 MG-UNIT per tablet, Take 1 tablet by mouth 2 (two) times daily.   Cholecalciferol (D3 VITAMIN PO), Take by mouth daily.   Cholecalciferol (VITAMIN D3) 50 MCG (2000 UT) capsule, Take 50,000 mg by mouth every 7 (seven) days.   dicyclomine (BENTYL) 20 MG tablet, Take 20 mg by mouth 2 (two) times daily as needed.   donepezil (ARICEPT) 10 MG tablet, Take 10 mg by mouth daily.   Ginkgo Biloba 40 MG TABS, Take by mouth.   loperamide (IMODIUM) 2 MG capsule, Take 1 capsule (2 mg total) by mouth as needed for diarrhea or loose stools.   meclizine (ANTIVERT) 25 MG tablet, Take 25 mg  by mouth as needed for dizziness.   Multiple Vitamin (MULTIVITAMIN) capsule, Take 1 capsule by mouth daily.     Na Sulfate-K Sulfate-Mg Sulfate concentrate (SUPREP BOWEL PREP KIT) 17.5-3.13-1.6 GM/177ML SOLN, Take 1 kit (354 mLs total) by mouth once for 1 dose.   nortriptyline (PAMELOR) 25 MG capsule, Take 1 capsule by mouth daily.   ondansetron (ZOFRAN) 4 MG tablet, Take 1 tablet by mouth as needed.   ondansetron (ZOFRAN-ODT) 4 MG disintegrating tablet, Take 4 mg by mouth as needed.   pantoprazole (PROTONIX) 40 MG tablet, Take 1 tablet by mouth daily.   potassium chloride (KLOR-CON) 10 MEQ tablet, Take 10 mEq by mouth daily.   ranitidine  (ZANTAC ) 300 MG capsule, Take 300 mg by mouth daily.   Saw Palmetto, Serenoa repens, (SAW PALMETTO PO), Take by mouth daily.   dexlansoprazole  (DEXILANT ) 60 MG capsule, Take 1 capsule (60 mg total) by mouth daily. (Patient not taking: Reported on 04/12/2024)  Medical History:  Past Medical History:  Diagnosis Date   Allergic rhinitis 03/28/2014   Atypical chest pain 05/08/2015   Caliectasis 05/23/2019   Chronic cough 01/20/2017   Dizziness 07/16/2019   Dyslipidemia 05/08/2015   Essential hypertension     GERD (gastroesophageal reflux disease)    Hyperlipidemia 06/26/2018   Hypertension    Near syncope 06/02/2015   Obesity (BMI 30-39.9) 03/28/2014   Occipital neuralgia of left side 07/29/2017   Occipital stroke (HCC) 11/20/2015   Osteopenia 06/26/2018   Recurrent urinary tract infection 06/14/2007   Sarcoidosis    Screening breast examination 07/11/2017   Stroke (HCC)    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   Contrast for kidneys. Pt states she had a MRI of her head and had the contrast with no reaction.      Surgical History:  She  has a past surgical history that includes Vesicovaginal fistula closure w/ TAH (at age 51); kidney stones; Cholecystectomy (2009); Breast biopsy (1992, 1993); Mediastinoscopy (2010); right arm fracture; left middle finger surgery (2008); pre cancer cells frozen off from uterus (1972); and Breast biopsy (Right, 10/15/2022). Family History:  Her family history includes Colon cancer in her mother; Dementia in her father and maternal grandmother; Diabetes in her son; Heart disease in her father, maternal grandfather, and mother; Ovarian cancer in her mother; Stroke in her mother.  REVIEW OF SYSTEMS  : All other systems reviewed and negative except where noted in the History of Present Illness.  PHYSICAL EXAM: BP (!) 160/80 (BP Location: Left Arm, Patient Position: Sitting, Cuff Size: Normal)   Pulse 72   Ht 5' 4 (1.626 m) Comment: height measured without shoes  Wt 144 lb 8 oz (65.5 kg)   BMI 24.80 kg/m  Physical Exam   GENERAL APPEARANCE: Well nourished, in no apparent distress HEENT: No cervical lymphadenopathy, unremarkable thyroid , sclerae anicteric, conjunctiva pink RESPIRATORY: Respiratory effort normal, BS equal bilateral without rales, rhonchi, wheezing CARDIO: RRR with no MRGs, peripheral pulses intact ABDOMEN: Soft,  non distended, active bowel sounds in all 4 quadrants, no tenderness to palpation, no rebound, no mass appreciated RECTAL: declines MUSCULOSKELETAL: Full ROM, normal gait, without edema SKIN: Dry, intact without rashes or lesions. No jaundice. NEURO: Alert, oriented, no focal deficits PSYCH: Cooperative, normal mood and affect.      Desiree JONELLE Coombs, PA-C 11:12 AM

## 2024-04-12 ENCOUNTER — Telehealth: Payer: Self-pay | Admitting: *Deleted

## 2024-04-12 ENCOUNTER — Ambulatory Visit: Admitting: Physician Assistant

## 2024-04-12 ENCOUNTER — Ambulatory Visit
Admission: RE | Admit: 2024-04-12 | Discharge: 2024-04-12 | Disposition: A | Discharge status: Home / Self Care | Source: Ambulatory Visit | Attending: Physician Assistant | Admitting: Physician Assistant

## 2024-04-12 ENCOUNTER — Encounter: Payer: Self-pay | Admitting: *Deleted

## 2024-04-12 ENCOUNTER — Encounter: Payer: Self-pay | Admitting: Physician Assistant

## 2024-04-12 ENCOUNTER — Other Ambulatory Visit (INDEPENDENT_AMBULATORY_CARE_PROVIDER_SITE_OTHER)

## 2024-04-12 VITALS — BP 160/80 | HR 72 | Ht 64.0 in | Wt 144.5 lb

## 2024-04-12 DIAGNOSIS — Z8673 Personal history of transient ischemic attack (TIA), and cerebral infarction without residual deficits: Secondary | ICD-10-CM | POA: Diagnosis not present

## 2024-04-12 DIAGNOSIS — R197 Diarrhea, unspecified: Secondary | ICD-10-CM

## 2024-04-12 DIAGNOSIS — Z800 Family history of malignant neoplasm of digestive organs: Secondary | ICD-10-CM

## 2024-04-12 DIAGNOSIS — K219 Gastro-esophageal reflux disease without esophagitis: Secondary | ICD-10-CM

## 2024-04-12 DIAGNOSIS — N200 Calculus of kidney: Secondary | ICD-10-CM | POA: Diagnosis not present

## 2024-04-12 DIAGNOSIS — K5900 Constipation, unspecified: Secondary | ICD-10-CM | POA: Diagnosis not present

## 2024-04-12 LAB — COMPREHENSIVE METABOLIC PANEL WITH GFR
ALT: 14 U/L (ref 0–35)
AST: 19 U/L (ref 0–37)
Albumin: 4.4 g/dL (ref 3.5–5.2)
Alkaline Phosphatase: 89 U/L (ref 39–117)
BUN: 12 mg/dL (ref 6–23)
CO2: 30 meq/L (ref 19–32)
Calcium: 9.9 mg/dL (ref 8.4–10.5)
Chloride: 102 meq/L (ref 96–112)
Creatinine, Ser: 0.93 mg/dL (ref 0.40–1.20)
GFR: 62.84 mL/min (ref >60.00)
Glucose, Bld: 98 mg/dL (ref 70–99)
Potassium: 3.9 meq/L (ref 3.5–5.1)
Sodium: 141 meq/L (ref 135–145)
Total Bilirubin: 0.4 mg/dL (ref 0.2–1.2)
Total Protein: 7.5 g/dL (ref 6.0–8.3)

## 2024-04-12 LAB — CBC WITH DIFFERENTIAL/PLATELET
Basophils Absolute: 0 K/uL (ref 0.0–0.1)
Basophils Relative: 1.1 % (ref 0.0–3.0)
Eosinophils Absolute: 0.1 K/uL (ref 0.0–0.7)
Eosinophils Relative: 3 % (ref 0.0–5.0)
HCT: 35.8 % — ABNORMAL LOW (ref 36.0–46.0)
Hemoglobin: 11.9 g/dL — ABNORMAL LOW (ref 12.0–15.0)
Lymphocytes Relative: 39 % (ref 12.0–46.0)
Lymphs Abs: 1.4 K/uL (ref 0.7–4.0)
MCHC: 33.2 g/dL (ref 30.0–36.0)
MCV: 89 fl (ref 78.0–100.0)
Monocytes Absolute: 0.3 K/uL (ref 0.1–1.0)
Monocytes Relative: 9.7 % (ref 3.0–12.0)
Neutro Abs: 1.7 K/uL (ref 1.4–7.7)
Neutrophils Relative %: 47.2 % (ref 43.0–77.0)
Platelets: 275 K/uL (ref 150.0–400.0)
RBC: 4.02 Mil/uL (ref 3.87–5.11)
RDW: 13.4 % (ref 11.5–15.5)
WBC: 3.5 K/uL — ABNORMAL LOW (ref 4.0–10.5)

## 2024-04-12 LAB — SEDIMENTATION RATE: Sed Rate: 11 mm/h (ref 0–30)

## 2024-04-12 LAB — TSH: TSH: 2.13 u[IU]/mL (ref 0.35–5.50)

## 2024-04-12 MED ORDER — NA SULFATE-K SULFATE-MG SULF 17.5-3.13-1.6 GM/177ML PO SOLN: 1.0000 | Freq: Once | ORAL | 0 refills | Status: AC

## 2024-04-12 MED ORDER — LOPERAMIDE HCL 2 MG PO CAPS: 2.0000 mg | ORAL_CAPSULE | ORAL | 0 refills | Status: AC | PRN

## 2024-04-12 NOTE — Telephone Encounter (Signed)
 Desiree Montgomery September 02, 1955 [id number]  04/12/2024  Dear Dr Camellia Krystal Custard,:  We have scheduled the above named patient for a(n) colonoscopy procedure. Our records show that (s)he is on anticoagulation therapy.  Please advise as to whether the patient may come off their therapy of Plavix 5 days prior to their procedure which is scheduled for 05/17/2024.  Please route your response to Desiree Montgomery  CMA or fax response to [phone].  Sincerely,    [facility]      Letter faxed to patients neurologist by [facility] and through manual fax today.     Dr Camellia Krystal [facility] [place] [id number] [phone]

## 2024-04-12 NOTE — Patient Instructions (Addendum)
 Your provider has requested that you go to the basement level for lab work before leaving today. Press B on the elevator. The lab is located at the first door on the left as you exit the elevator.  Your provider has requested that you have an abdominal x ray before leaving today. Please go to the basement floor to our Radiology department for the test.  Go to the ER if any severe abdominal pain, fever, or weakness  You have been scheduled for a colonoscopy. Please follow written instructions given to you at your visit today.   If you use inhalers (even only as needed), please bring them with you on the day of your procedure.  DO NOT TAKE 7 DAYS PRIOR TO TEST- Trulicity (dulaglutide) Ozempic, Wegovy (semaglutide) Mounjaro (tirzepatide) Bydureon Bcise (exanatide extended release)  DO NOT TAKE 1 DAY PRIOR TO YOUR TEST Rybelsus (semaglutide) Adlyxin (lixisenatide) Victoza (liraglutide) Byetta (exanatide) ___________________________________________________________________________  Due to recent changes in healthcare laws, you may see the results of your imaging and laboratory studies on MyChart before your provider has had a chance to review them.  We understand that in some cases there may be results that are confusing or concerning to you. Not all laboratory results come back in the same time frame and the provider may be waiting for multiple results in order to interpret others.  Please give us  48 hours in order for your provider to thoroughly review all the results before contacting the office for clarification of your results.    - Drink a lot of liquids that have water, salt, and sugar. Good choices are water mixed with juice, flavored soda, and soup broth. If you are drinking enough, your urine will be light yellow or almost clear.  - Try to eat a little food. Good choices are potatoes, noodles, rice, oatmeal, crackers, bananas, soup, and boiled vegetables.  - Avoid high fat foods, as  they can make diarrhea worse.  - Dairy products (except yogurt) may be difficult to digest when you have diarrhea. I recommend that you temporarily avoid lactose-containing foods.  - Can try loperamide 4 mg initially, then 2 mg after each unformed stool for =2 days, with a maximum of 16 mg/day.  - If loperamide is not working, you could try bismuth salicylate (Pepto-Bismol) 30 mL or two tablets every 30 minutes for eight doses. Pepto-Bismol may make your stools black.   Go to the ER if any severe abdominal pain, fever, or weakness  What Is Microscopic Colitis? Microscopic colitis is a condition that causes chronic, watery diarrhea. Unlike other types of colitis (like ulcerative colitis or Crohn's disease), the colon (large intestine) appears normal during a colonoscopy. The inflammation can only be seen under a microscope--hence the name.  There are two main types:  Lymphocytic colitis - increased white blood cells (lymphocytes) in the colon lining Collagenous colitis - thickened layer of collagen (a protein) in the colon lining  Common Symptoms  Ongoing watery diarrhea (often multiple times a day) Urgency to have a bowel movement Abdominal pain or cramping Bloating or gas Fatigue Weight loss (less common)  Causes and Risk Factors The exact cause isn't fully known, but possible contributing factors include:  Immune system reactions  Medications, such as: NSAIDs (e.g., ibuprofen) Proton pump inhibitors (e.g., omeprazole) SSRIs (e.g., sertraline) Smoking Older age (most common in people over 58) Female sex (more common in women)   How Is It Diagnosed?  Colonoscopy: usually appears normal  Biopsy (tiny tissue sample from  the colon) is needed to see inflammation under a microscope  Treatment Options Treatment depends on how severe your symptoms are:  Lifestyle & Diet Changes  Avoid trigger foods (e.g., caffeine, dairy, fatty foods) Quit smoking Reduce alcohol Stay  hydrated  Medications  Anti-diarrheal meds (like loperamide/Imodium) Budesonide (a corticosteroid with fewer side effects, often the first choice for moderate-to-severe cases) Bismuth subsalicylate (e.g., Pepto-Bismol) Stopping or switching medications that may be triggering the condition   What's the Outlook?  Many people improve with treatment. The condition may come and go. It's not associated with colon cancer. Regular follow-up helps keep symptoms under control.  I appreciate the  opportunity to care for you  Thank You   [facility]

## 2024-04-13 ENCOUNTER — Ambulatory Visit

## 2024-04-13 ENCOUNTER — Ambulatory Visit: Payer: Self-pay | Admitting: Physician Assistant

## 2024-04-13 ENCOUNTER — Other Ambulatory Visit: Payer: Self-pay

## 2024-04-13 DIAGNOSIS — D649 Anemia, unspecified: Secondary | ICD-10-CM | POA: Diagnosis not present

## 2024-04-13 LAB — IBC + FERRITIN
Ferritin: 112.8 ng/mL (ref 10.0–291.0)
Iron: 50 ug/dL (ref 42–145)
Saturation Ratios: 16.5 % — ABNORMAL LOW (ref 20.0–50.0)
TIBC: 302.4 ug/dL (ref 250.0–450.0)
Transferrin: 216 mg/dL (ref 212.0–360.0)

## 2024-04-13 LAB — VITAMIN B12: Vitamin B-12: 944 pg/mL — ABNORMAL HIGH (ref 211–911)

## 2024-04-13 LAB — TISSUE TRANSGLUTAMINASE, IGA: (tTG) Ab, IgA: <1 U/mL

## 2024-04-13 LAB — IGA: Immunoglobulin A: 313 mg/dL (ref 70–320)

## 2024-04-16 ENCOUNTER — Ambulatory Visit: Payer: Self-pay | Admitting: Physician Assistant

## 2024-04-24 NOTE — Telephone Encounter (Signed)
Re-faxed x2

## 2024-05-02 NOTE — Telephone Encounter (Signed)
 Patient approved to come off Plavix 5 days before her procedure

## 2024-05-03 NOTE — Telephone Encounter (Signed)
 Patient aware

## 2024-05-10 ENCOUNTER — Encounter: Payer: Self-pay | Admitting: Gastroenterology

## 2024-05-17 ENCOUNTER — Ambulatory Visit: Admitting: Gastroenterology

## 2024-05-17 ENCOUNTER — Ambulatory Visit
Admission: RE | Admit: 2024-05-17 | Discharge: 2024-05-17 | Disposition: A | Source: Ambulatory Visit | Attending: Gastroenterology | Admitting: Gastroenterology

## 2024-05-17 ENCOUNTER — Encounter: Payer: Self-pay | Admitting: Gastroenterology

## 2024-05-17 VITALS — BP 166/99 | HR 89 | Temp 97.3°F | Resp 22 | Ht 64.0 in | Wt 144.8 lb

## 2024-05-17 DIAGNOSIS — R109 Unspecified abdominal pain: Secondary | ICD-10-CM

## 2024-05-17 DIAGNOSIS — D122 Benign neoplasm of ascending colon: Secondary | ICD-10-CM | POA: Diagnosis not present

## 2024-05-17 DIAGNOSIS — K5730 Diverticulosis of large intestine without perforation or abscess without bleeding: Secondary | ICD-10-CM | POA: Diagnosis not present

## 2024-05-17 DIAGNOSIS — K648 Other hemorrhoids: Secondary | ICD-10-CM | POA: Diagnosis not present

## 2024-05-17 DIAGNOSIS — K529 Noninfective gastroenteritis and colitis, unspecified: Secondary | ICD-10-CM

## 2024-05-17 DIAGNOSIS — R197 Diarrhea, unspecified: Secondary | ICD-10-CM

## 2024-05-17 MED ORDER — SODIUM CHLORIDE 0.9 % IV SOLN
500.0000 mL | INTRAVENOUS | Status: DC
Start: 2024-05-17 — End: 2024-05-17

## 2024-05-17 NOTE — Progress Notes (Signed)
 1525 Patient experiencing nausea and retching.  Zofran 4 mg IV given. Robinul 0.2 mg IV given due large amount of secretions upon assessment.  MD made aware, vss

## 2024-05-17 NOTE — Progress Notes (Signed)
 Post colonoscopy note   patient had abdominal pain after colonoscopy.   abdomen soft  she has been passing gas.  2V x-ray KUB was performed. Neg for any perforation. Had moderate amount of gas and torturous colon. Discussed with patient and family. She is having chronic abdominal pain   Similar per patient's husband.    Per patient's husband she had been to ED in  [facility] few days ago and had similar problems, underwent CT Abdo/pelvis.

## 2024-05-17 NOTE — Progress Notes (Signed)
 Called to room to assist during endoscopic procedure.  Patient ID and intended procedure confirmed with present staff. Received instructions for my participation in the procedure from the performing physician.

## 2024-05-17 NOTE — Progress Notes (Signed)
 STAT xray ordered for abdominal pain.  Patient sent to xray prior to discharge.

## 2024-05-17 NOTE — Patient Instructions (Signed)
 REPEAT Colonoscopy in 3-6 months for surveillance with a 2-day prep.  Office will contact you to schedule appointment at a later date.   RESUME Plavix 05/22/24  YOU HAD AN ENDOSCOPIC PROCEDURE TODAY AT THE [facility]:   Refer to the procedure report that was given to you for any specific questions about what was found during the examination.  If the procedure report does not answer your questions, please call your gastroenterologist to clarify.  If you requested that your care partner not be given the details of your procedure findings, then the procedure report has been included in a sealed envelope for you to review at your convenience later.  YOU SHOULD EXPECT: Some feelings of bloating in the abdomen. Passage of more gas than usual.  Walking can help get rid of the air that was put into your GI tract during the procedure and reduce the bloating. If you had a lower endoscopy (such as a colonoscopy or flexible sigmoidoscopy) you may notice spotting of blood in your stool or on the toilet paper. If you underwent a bowel prep for your procedure, you may not have a normal bowel movement for a few days.  Please Note:  You might notice some irritation and congestion in your nose or some drainage.  This is from the oxygen used during your procedure.  There is no need for concern and it should clear up in a day or so.  SYMPTOMS TO REPORT IMMEDIATELY:  Following lower endoscopy (colonoscopy or flexible sigmoidoscopy):  Excessive amounts of blood in the stool  Significant tenderness or worsening of abdominal pains  Swelling of the abdomen that is new, acute  Fever of 100F or higher  For urgent or emergent issues, a gastroenterologist can be reached at any hour by calling [phone]. Do not use MyChart messaging for urgent concerns.    DIET:  We do recommend a small meal at first, but then you may proceed to your regular diet.  Drink plenty of fluids but you should avoid alcoholic  beverages for 24 hours.  ACTIVITY:  You should plan to take it easy for the rest of today and you should NOT DRIVE or use heavy machinery until tomorrow (because of the sedation medicines used during the test).    FOLLOW UP: Our staff will call the number listed on your records the next business day following your procedure.  We will call around 7:15- 8:00 am to check on you and address any questions or concerns that you may have regarding the information given to you following your procedure. If we do not reach you, we will leave a message.     If any biopsies were taken you will be contacted by phone or by letter within the next 1-3 weeks.  Please call us  at [phone] if you have not heard about the biopsies in 3 weeks.    SIGNATURES/CONFIDENTIALITY: You and/or your care partner have signed paperwork which will be entered into your electronic medical record.  These signatures attest to the fact that that the information above on your After Visit Summary has been reviewed and is understood.  Full responsibility of the confidentiality of this discharge information lies with you and/or your care-partner.

## 2024-05-17 NOTE — Op Note (Signed)
 [facility] Patient Name: Desiree Montgomery Procedure Date: 05/17/2024 2:45 PM MRN: [id number] Endoscopist: Lynnie Bring , MD, [id number] Age: 68 Referring MD:  Date of Birth: Sep 23, 1955 Gender: Female Account #: [id number] Procedure:                Colonoscopy Indications:              Chronic diarrhea Medicines:                Monitored Anesthesia Care Procedure:                Pre-Anesthesia Assessment:                           - Prior to the procedure, a History and Physical                            was performed, and patient medications and                            allergies were reviewed. The patient's tolerance of                            previous anesthesia was also reviewed. The risks                            and benefits of the procedure and the sedation                            options and risks were discussed with the patient.                            All questions were answered, and informed consent                            was obtained. Prior Anticoagulants: The patient has                            taken Plavix (clopidogrel), last dose was 5 days                            prior to procedure. ASA Grade Assessment: III - A                            patient with severe systemic disease. After                            reviewing the risks and benefits, the patient was                            deemed in satisfactory condition to undergo the                            procedure.  After obtaining informed consent, the colonoscope                            was passed under direct vision. Throughout the                            procedure, the patient's blood pressure, pulse, and                            oxygen saturations were monitored continuously. The                            Olympus Scope PCF SN [id number] was introduced through                            the anus and advanced to the 2 cm into the ileum.                             The scope was passed with some difficulty through                            the sigmoid colon due to multiple diverticula. The                            colonoscopy was somewhat difficult due to multiple                            diverticula in the colon and inadequate bowel prep                            especially in the right colon. Successful                            completion of the procedure was aided by lavage.                            Despite aggressive suctioning and aspiration only                            50 to 55% of the colonic mucosa is visualized                            satisfactorily. Of note the small and flat lesions                            especially in the right colon could have been                            missed. The patient tolerated the procedure well.                            The quality of the bowel preparation was poor. The  terminal ileum, ileocecal valve, appendiceal                            orifice, and rectum were photographed. Scope In: 3:20:13 PM Scope Out: 3:45:47 PM Scope Withdrawal Time: 0 hours 17 minutes 59 seconds  Total Procedure Duration: 0 hours 25 minutes 34 seconds  Findings:                 An 8 mm polyp was found in the proximal ascending                            colon, seen after lavage. The polyp was sessile.                            The polyp was removed with a cold snare. Resection                            and retrieval were complete.                           The colon (entire examined portion) appeared                            normal. Biopsies for histology were taken with a                            cold forceps from colon for evaluation of                            microscopic colitis.                           Multiple large-mouthed and medium-mouthed                            diverticula were found in the sigmoid colon. There                            was  luminal narrowing consistent with muscular                            hypertrophy. There were few diverticula with stool                            impacted.                           The terminal ileum appeared normal.                           Non-bleeding internal hemorrhoids were found during                            retroflexion. The hemorrhoids were small and Grade  I (internal hemorrhoids that do not prolapse).                           The exam was otherwise without abnormality on                            direct and retroflexion views. Complications:            No immediate complications. Estimated Blood Loss:     Estimated blood loss: none. Impression:               - Preparation of the colon was poor.                           - One 8 mm polyp in the proximal ascending colon,                            removed with a cold snare. Resected and retrieved.                           - The entire examined colon is normal. Biopsied.                           - Diverticulosis in the sigmoid colon.                           - The examined portion of the ileum was normal.                           - Non-bleeding internal hemorrhoids.                           - The examination was otherwise normal on direct                            and retroflexion views. Recommendation:           - Patient has a contact number available for                            emergencies. The signs and symptoms of potential                            delayed complications were discussed with the                            patient. Return to normal activities tomorrow.                            Written discharge instructions were provided to the                            patient.                           - Resume previous diet.                           -  Continue present medications.                           - Await pathology results.                           - Repeat  colonoscopy in 3-6 months for surveillance                            with a 2-day prep.                           - Resume Plavix 11/25                           - The findings and recommendations were discussed                            with the patient's family. Lynnie Bring, MD 05/17/2024 3:55:53 PM This report has been signed electronically.

## 2024-05-17 NOTE — Progress Notes (Signed)
 Report given to PACU, vss

## 2024-05-17 NOTE — Progress Notes (Signed)
 04/12/2024 Desiree Montgomery [id number] 1955-07-17   Referring provider: Gable Cambric, MD [facility] internal medicine) Primary GI doctor: Dr. Charlanne   ASSESSMENT AND PLAN:  Chronic diarrhea x July 30th associated frequency, urgency has slowed down in Sept and then got worse, fecal incontinence, nocturnal symptoms, watery stools Actively trying to lose weight, denies fever, chills 02/06/2024 negative lacticferritin C. difficile Giardia cryptosporidium ova and parasite -KUB to evaluate for stool burden/obstruction  -CRP/ESR, consider fecal calprotectin if elevated - TSH  -TTG/IGA to evaluate for celiac disease.  -Can do trial of IBGARD daily,  Bentyl as needed -Add on citracel/benefiber, FODMAP, trial off lactulose and lifestyle changes discussed -Imodium  as needed for now, can take prior to eating or travel -Will schedule for endoscopic evaluation to rule out microscopic colitis/malignancy with history, discussed with patient and agrees with plan. Risk of bowel prep, conscious sedation, colonoscopy were discussed.  Treatment plan was discussed with patient, and agreed upon.  -Consider SIBO testing or xifaxin trial if negative versus colestipol trial   Family history of colon cancer (colon cancer in mother age 55) Previous normal colonoscopy 2016 States had recall in 10 years Unable to see report   History of CVA On Plavix from Dr. Gable Hold Plavix for 5 days before procedure will instruct when and how to resume after procedure.  Patient understands that there is a low but real risk of cardiovascular event such as heart attack, stroke, or embolism /  thrombosis, or ischemia while off Plavix.  The patient consents to proceed.  Will communicate by phone or EMR with patient's prescribing provider to confirm that holding Plavix is reasonable in this case.    GERD On lansoprazole and famotidine Controlled at this time   History of sarcoidosis Follows with Dr. Jude Not on  oxygen is not followed up a long time   Patient Care Team: Gable Cambric, MD as PCP - General (Internal Medicine) Brien Belvie BRAVO, MD (Pulmonary Disease)   HISTORY OF PRESENT ILLNESS: 68 y.o. female with a past medical history listed below presents for evaluation of Diarrhea.    Discussed the use of AI scribe software for clinical note transcription with the patient, who gave verbal consent to proceed.   History of Present Illness   Desiree Montgomery is a 68 year old female who presents with chronic diarrhea.   She has been experiencing diarrhea since September, coinciding with her birthday. The onset was sudden, with episodes of fecal incontinence due to an inability to reach the bathroom in time. Initially, the diarrhea occurred daily or every other day and persisted until November, with a decrease in frequency starting in October. The episodes are large in volume and often urgent, sometimes requiring her to stop during car rides to relieve herself. She also experiences nocturnal symptoms, waking up at night to use the bathroom.   No fever, chills, or blood in the stool. She has been actively trying to lose weight and has reached 141 pounds, which she feels is a good weight. However, the diarrhea contributed to a more rapid weight loss than intended. No dark or black stools, abdominal pain, or significant heartburn, though she has experienced heartburn once or twice.   Her past medical history includes a cholecystectomy approximately 20 years ago. She is currently on Plavix and nortelazine. No shortness of breath or chest pain, although she occasionally feels something related to a past stroke.   Family history is significant for colon cancer in her mother.  Wt Readings from Last 2 Encounters:  04/12/24 144 lb 8 oz (65.5 kg)  01/07/23 169 lb 9.6 oz (76.9 kg)        She  reports that she has never smoked. She has never used smokeless tobacco. She reports current alcohol  use. She reports that she does not use drugs.   RELEVANT GI HISTORY, IMAGING AND LABS: Results            CBC Labs (Brief)          Component Value Date/Time    WBC 3.6 (L) 04/04/2008 0914    RBC 4.76 04/04/2008 0914    HGB 14.0 04/04/2008 0914    HCT 41.5 04/04/2008 0914    PLT 270 04/04/2008 0914    MCV 87.3 04/04/2008 0914    MCHC 33.6 04/04/2008 0914    RDW 12.9 04/04/2008 0914      Recent Labs (within last 365 days)  No results for input(s): HGB in the last 8760 hours.     CMP     Labs (Brief)          Component Value Date/Time    NA 141 05/25/2017 1012    K 4.4 05/25/2017 1012    CL 100 05/25/2017 1012    CO2 26 05/25/2017 1012    GLUCOSE 93 05/25/2017 1012    GLUCOSE 79 04/04/2008 0914    BUN 9 05/25/2017 1012    CREATININE 0.84 05/25/2017 1012    CALCIUM 9.9 05/25/2017 1012    PROT 7.4 05/25/2017 1012    ALBUMIN 4.4 05/25/2017 1012    AST 19 05/25/2017 1012    ALT 15 05/25/2017 1012    ALKPHOS 103 05/25/2017 1012    BILITOT 0.4 05/25/2017 1012    GFRNONAA 75 05/25/2017 1012    GFRAA 86 05/25/2017 1012          Latest Ref Rng & Units 05/25/2017   10:12 AM 04/04/2008    9:14 AM  Hepatic Function  Total Protein 6.0 - 8.5 g/dL 7.4  7.4   Albumin 3.6 - 4.8 g/dL 4.4  3.7   AST 0 - 40 IU/L 19  28   ALT 0 - 32 IU/L 15  25   Alk Phosphatase 39 - 117 IU/L 103  81   Total Bilirubin 0.0 - 1.2 mg/dL 0.4  0.4       Current Medications:      Current Outpatient Medications (Cardiovascular):    atorvastatin (LIPITOR) 40 MG tablet, Take 40 mg by mouth daily.   furosemide (LASIX) 20 MG tablet, Take 20 mg by mouth daily.    diltiazem (CARDIZEM CD) 180 MG 24 hr capsule, Take 180 mg by mouth daily. (Patient not taking: Reported on 04/12/2024)   Current Outpatient Medications (Respiratory):    loratadine (CLARITIN) 10 MG tablet, Take 10 mg by mouth daily.   montelukast  (SINGULAIR ) 10 MG tablet, Take 1 tablet (10 mg total) by mouth at bedtime.    promethazine-dextromethorphan (PROMETHAZINE-DM) 6.25-15 MG/5ML syrup, Take 6.25 mg by mouth as needed.     Current Outpatient Medications (Hematological):    clopidogrel (PLAVIX) 75 MG tablet, Take 75 mg by mouth daily.   Current Outpatient Medications (Other):    BIOTIN PO, Take by mouth daily.   calcium-vitamin D (OSCAL WITH D) 500-200 MG-UNIT per tablet, Take 1 tablet by mouth 2 (two) times daily.   Cholecalciferol (D3 VITAMIN PO), Take by mouth daily.   Cholecalciferol (VITAMIN D3) 50 MCG (2000 UT) capsule, Take  50,000 mg by mouth every 7 (seven) days.   dicyclomine (BENTYL) 20 MG tablet, Take 20 mg by mouth 2 (two) times daily as needed.   donepezil (ARICEPT) 10 MG tablet, Take 10 mg by mouth daily.   Ginkgo Biloba 40 MG TABS, Take by mouth.   loperamide  (IMODIUM ) 2 MG capsule, Take 1 capsule (2 mg total) by mouth as needed for diarrhea or loose stools.   meclizine (ANTIVERT) 25 MG tablet, Take 25 mg by mouth as needed for dizziness.   Multiple Vitamin (MULTIVITAMIN) capsule, Take 1 capsule by mouth daily.     Na Sulfate-K Sulfate-Mg Sulfate concentrate (SUPREP BOWEL PREP KIT) 17.5-3.13-1.6 GM/177ML SOLN, Take 1 kit (354 mLs total) by mouth once for 1 dose.   nortriptyline (PAMELOR) 25 MG capsule, Take 1 capsule by mouth daily.   ondansetron (ZOFRAN) 4 MG tablet, Take 1 tablet by mouth as needed.   ondansetron (ZOFRAN-ODT) 4 MG disintegrating tablet, Take 4 mg by mouth as needed.   pantoprazole (PROTONIX) 40 MG tablet, Take 1 tablet by mouth daily.   potassium chloride (KLOR-CON) 10 MEQ tablet, Take 10 mEq by mouth daily.   ranitidine  (ZANTAC ) 300 MG capsule, Take 300 mg by mouth daily.   Saw Palmetto, Serenoa repens, (SAW PALMETTO PO), Take by mouth daily.   dexlansoprazole  (DEXILANT ) 60 MG capsule, Take 1 capsule (60 mg total) by mouth daily. (Patient not taking: Reported on 04/12/2024)   Medical History:      Past Medical History:  Diagnosis Date   Allergic rhinitis 03/28/2014    Atypical chest pain 05/08/2015   Caliectasis 05/23/2019   Chronic cough 01/20/2017   Dizziness 07/16/2019   Dyslipidemia 05/08/2015   Essential hypertension     GERD (gastroesophageal reflux disease)     Hyperlipidemia 06/26/2018   Hypertension     Near syncope 06/02/2015   Obesity (BMI 30-39.9) 03/28/2014   Occipital neuralgia of left side 07/29/2017   Occipital stroke (HCC) 11/20/2015   Osteopenia 06/26/2018   Recurrent urinary tract infection 06/14/2007   Sarcoidosis     Screening breast examination 07/11/2017   Stroke (HCC)     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   Contrast for kidneys. Pt states she had a MRI of her head and had the contrast with no reaction.         Surgical History:  She  has a past surgical history that includes Vesicovaginal fistula closure w/ TAH (at age 74); kidney stones; Cholecystectomy (2009); Breast biopsy (1992, 1993); Mediastinoscopy (2010); right arm fracture; left middle finger surgery (2008); pre cancer cells frozen off from uterus (1972); and Breast biopsy (Right, 10/15/2022). Family History:  Her family history includes Colon cancer in her mother; Dementia in her father and maternal grandmother; Diabetes in her son; Heart disease in her father, maternal grandfather, and mother; Ovarian cancer in her mother; Stroke in her mother.   REVIEW OF SYSTEMS  : All other systems reviewed and negative except where noted in the History of Present Illness.   PHYSICAL EXAM: BP (!) 160/80 (BP Location: Left Arm, Patient Position: Sitting, Cuff Size: Normal)   Pulse 72   Ht 5' 4 (1.626 m) Comment: height measured without shoes  Wt 144 lb 8 oz (65.5 kg)   BMI 24.80  kg/m  Physical Exam   GENERAL APPEARANCE: Well nourished, in no apparent distress HEENT: No cervical lymphadenopathy, unremarkable  thyroid , sclerae anicteric, conjunctiva pink RESPIRATORY: Respiratory effort normal, BS equal bilateral without rales, rhonchi, wheezing CARDIO: RRR with no MRGs, peripheral pulses intact ABDOMEN: Soft, non distended, active bowel sounds in all 4 quadrants, no tenderness to palpation, no rebound, no mass appreciated RECTAL: declines MUSCULOSKELETAL: Full ROM, normal gait, without edema SKIN: Dry, intact without rashes or lesions. No jaundice. NEURO: Alert, oriented, no focal deficits PSYCH: Cooperative, normal mood and affect.       Alan JONELLE Coombs, PA-C   Attending physician's note   I have taken history, reviewed the chart and examined the patient. I performed a substantive portion of this encounter, including complete performance of at least one of the key components, in conjunction with the APP. I agree with the Advanced Practitioner's note, impression and recommendations.    Anselm Bring, MD [facility] [phone]

## 2024-05-18 ENCOUNTER — Telehealth: Payer: Self-pay

## 2024-05-18 NOTE — Telephone Encounter (Signed)
  Follow up Call-     05/17/2024    1:56 PM  Call back number  Post procedure Call Back phone  # [phone]  Permission to leave phone message Yes    Attempted to call patient regarding follow-up. No answer, VM left.

## 2024-05-22 LAB — SURGICAL PATHOLOGY

## 2024-05-23 ENCOUNTER — Ambulatory Visit: Payer: Self-pay | Admitting: Gastroenterology

## 2024-09-20 ENCOUNTER — Ambulatory Visit: Admitting: Cardiology
